# Patient Record
Sex: Female | Born: 1975 | Race: Black or African American | Hispanic: No | Marital: Single | Smoking: Current every day smoker
Health system: Southern US, Community
[De-identification: ages and names within clinical notes are randomized; demographics above are authoritative.]

## PROBLEM LIST (undated history)

## (undated) DIAGNOSIS — Z720 Tobacco use: Secondary | ICD-10-CM

## (undated) DIAGNOSIS — K8020 Calculus of gallbladder without cholecystitis without obstruction: Secondary | ICD-10-CM

## (undated) DIAGNOSIS — K219 Gastro-esophageal reflux disease without esophagitis: Secondary | ICD-10-CM

---

## 1998-09-01 ENCOUNTER — Emergency Department (HOSPITAL_COMMUNITY): Admission: EM | Admit: 1998-09-01 | Discharge: 1998-09-01 | Payer: Self-pay | Admitting: Emergency Medicine

## 1999-01-22 ENCOUNTER — Emergency Department (HOSPITAL_COMMUNITY): Admission: EM | Admit: 1999-01-22 | Discharge: 1999-01-22 | Payer: Self-pay | Admitting: Emergency Medicine

## 1999-06-17 ENCOUNTER — Ambulatory Visit (HOSPITAL_COMMUNITY): Admission: RE | Admit: 1999-06-17 | Discharge: 1999-06-17 | Payer: Self-pay | Admitting: Obstetrics & Gynecology

## 1999-07-01 ENCOUNTER — Inpatient Hospital Stay (HOSPITAL_COMMUNITY): Admission: AD | Admit: 1999-07-01 | Discharge: 1999-07-01 | Payer: Self-pay | Admitting: Obstetrics

## 1999-08-17 ENCOUNTER — Ambulatory Visit (HOSPITAL_COMMUNITY): Admission: RE | Admit: 1999-08-17 | Discharge: 1999-08-17 | Payer: Self-pay | Admitting: *Deleted

## 1999-12-16 ENCOUNTER — Inpatient Hospital Stay (HOSPITAL_COMMUNITY): Admission: AD | Admit: 1999-12-16 | Discharge: 1999-12-16 | Payer: Self-pay | Admitting: Obstetrics

## 2000-01-18 ENCOUNTER — Encounter: Payer: Self-pay | Admitting: Obstetrics

## 2000-01-18 ENCOUNTER — Inpatient Hospital Stay (HOSPITAL_COMMUNITY): Admission: AD | Admit: 2000-01-18 | Discharge: 2000-01-21 | Payer: Self-pay | Admitting: Obstetrics

## 2000-01-18 ENCOUNTER — Encounter (INDEPENDENT_AMBULATORY_CARE_PROVIDER_SITE_OTHER): Payer: Self-pay | Admitting: Specialist

## 2001-12-29 ENCOUNTER — Emergency Department (HOSPITAL_COMMUNITY): Admission: EM | Admit: 2001-12-29 | Discharge: 2001-12-29 | Payer: Self-pay | Admitting: *Deleted

## 2003-06-24 ENCOUNTER — Emergency Department (HOSPITAL_COMMUNITY): Admission: EM | Admit: 2003-06-24 | Discharge: 2003-06-24 | Payer: Self-pay | Admitting: Emergency Medicine

## 2003-12-27 ENCOUNTER — Emergency Department (HOSPITAL_COMMUNITY): Admission: EM | Admit: 2003-12-27 | Discharge: 2003-12-27 | Payer: Self-pay

## 2004-09-08 ENCOUNTER — Emergency Department (HOSPITAL_COMMUNITY): Admission: EM | Admit: 2004-09-08 | Discharge: 2004-09-08 | Payer: Self-pay | Admitting: Emergency Medicine

## 2004-12-31 ENCOUNTER — Ambulatory Visit: Payer: Self-pay | Admitting: Sports Medicine

## 2005-01-06 ENCOUNTER — Encounter (INDEPENDENT_AMBULATORY_CARE_PROVIDER_SITE_OTHER): Payer: Self-pay | Admitting: *Deleted

## 2005-01-06 LAB — CONVERTED CEMR LAB

## 2005-01-07 ENCOUNTER — Ambulatory Visit: Payer: Self-pay | Admitting: Family Medicine

## 2005-01-07 ENCOUNTER — Other Ambulatory Visit: Admission: RE | Admit: 2005-01-07 | Discharge: 2005-01-07 | Payer: Self-pay | Admitting: Family Medicine

## 2005-01-11 ENCOUNTER — Ambulatory Visit (HOSPITAL_COMMUNITY): Admission: RE | Admit: 2005-01-11 | Discharge: 2005-01-11 | Payer: Self-pay | Admitting: Family Medicine

## 2005-02-02 ENCOUNTER — Ambulatory Visit: Payer: Self-pay | Admitting: Family Medicine

## 2005-03-11 ENCOUNTER — Ambulatory Visit: Payer: Self-pay | Admitting: Family Medicine

## 2005-03-15 ENCOUNTER — Ambulatory Visit: Payer: Self-pay | Admitting: Family Medicine

## 2005-03-26 ENCOUNTER — Ambulatory Visit: Payer: Self-pay | Admitting: Family Medicine

## 2005-04-08 ENCOUNTER — Ambulatory Visit: Payer: Self-pay | Admitting: Family Medicine

## 2005-04-22 ENCOUNTER — Ambulatory Visit: Payer: Self-pay | Admitting: Family Medicine

## 2005-05-05 ENCOUNTER — Ambulatory Visit: Payer: Self-pay | Admitting: Family Medicine

## 2005-05-11 ENCOUNTER — Ambulatory Visit: Payer: Self-pay | Admitting: *Deleted

## 2005-06-01 ENCOUNTER — Inpatient Hospital Stay (HOSPITAL_COMMUNITY): Admission: AD | Admit: 2005-06-01 | Discharge: 2005-06-04 | Payer: Self-pay | Admitting: Obstetrics and Gynecology

## 2005-06-01 ENCOUNTER — Ambulatory Visit: Payer: Self-pay | Admitting: Obstetrics and Gynecology

## 2005-06-01 ENCOUNTER — Encounter (INDEPENDENT_AMBULATORY_CARE_PROVIDER_SITE_OTHER): Payer: Self-pay | Admitting: *Deleted

## 2005-07-12 ENCOUNTER — Ambulatory Visit: Payer: Self-pay | Admitting: Sports Medicine

## 2005-09-19 ENCOUNTER — Emergency Department (HOSPITAL_COMMUNITY): Admission: EM | Admit: 2005-09-19 | Discharge: 2005-09-19 | Payer: Self-pay | Admitting: Emergency Medicine

## 2007-02-03 ENCOUNTER — Encounter (INDEPENDENT_AMBULATORY_CARE_PROVIDER_SITE_OTHER): Payer: Self-pay | Admitting: *Deleted

## 2008-08-16 ENCOUNTER — Emergency Department (HOSPITAL_COMMUNITY): Admission: EM | Admit: 2008-08-16 | Discharge: 2008-08-16 | Payer: Self-pay | Admitting: Family Medicine

## 2009-07-01 ENCOUNTER — Emergency Department (HOSPITAL_COMMUNITY): Admission: EM | Admit: 2009-07-01 | Discharge: 2009-07-01 | Payer: Self-pay | Admitting: Emergency Medicine

## 2009-08-31 ENCOUNTER — Emergency Department (HOSPITAL_COMMUNITY): Admission: EM | Admit: 2009-08-31 | Discharge: 2009-08-31 | Payer: Self-pay | Admitting: Emergency Medicine

## 2010-02-01 ENCOUNTER — Emergency Department (HOSPITAL_COMMUNITY): Admission: EM | Admit: 2010-02-01 | Discharge: 2010-02-01 | Payer: Self-pay | Admitting: Family Medicine

## 2010-06-09 ENCOUNTER — Emergency Department (HOSPITAL_COMMUNITY): Admission: EM | Admit: 2010-06-09 | Discharge: 2010-06-09 | Payer: Self-pay | Admitting: Family Medicine

## 2010-06-11 ENCOUNTER — Emergency Department (HOSPITAL_COMMUNITY): Admission: EM | Admit: 2010-06-11 | Discharge: 2010-06-11 | Payer: Self-pay | Admitting: Emergency Medicine

## 2010-10-19 ENCOUNTER — Emergency Department (HOSPITAL_COMMUNITY): Admission: EM | Admit: 2010-10-19 | Discharge: 2010-10-20 | Payer: Self-pay | Admitting: Emergency Medicine

## 2011-02-16 LAB — DIFFERENTIAL
Basophils Relative: 0 % (ref 0–1)
Eosinophils Absolute: 0.2 10*3/uL (ref 0.0–0.7)
Eosinophils Relative: 2 % (ref 0–5)
Lymphs Abs: 2.1 10*3/uL (ref 0.7–4.0)
Neutrophils Relative %: 70 % (ref 43–77)

## 2011-02-16 LAB — COMPREHENSIVE METABOLIC PANEL
ALT: 23 U/L (ref 0–35)
AST: 23 U/L (ref 0–37)
BUN: 8 mg/dL (ref 6–23)
CO2: 23 mEq/L (ref 19–32)
Chloride: 105 mEq/L (ref 96–112)
GFR calc Af Amer: >60 mL/min (ref >60)
Glucose, Bld: 100 mg/dL — ABNORMAL HIGH (ref 70–99)
Potassium: 3.9 mEq/L (ref 3.5–5.1)
Total Protein: 7.7 g/dL (ref 6.0–8.3)

## 2011-02-16 LAB — CBC
MCH: 32 pg (ref 26.0–34.0)
MCHC: 35 g/dL (ref 30.0–36.0)
Platelets: 216 10*3/uL (ref 150–400)
RBC: 4.27 MIL/uL (ref 3.87–5.11)
WBC: 11 10*3/uL — ABNORMAL HIGH (ref 4.0–10.5)

## 2011-03-14 LAB — URINALYSIS, ROUTINE W REFLEX MICROSCOPIC
Ketones, ur: NEGATIVE mg/dL
Protein, ur: NEGATIVE mg/dL

## 2011-03-14 LAB — URINE MICROSCOPIC-ADD ON

## 2011-04-23 NOTE — Discharge Summary (Signed)
[facility]  Patient:    [name], [name]                     MRN: [id number] Adm. Date:  [id number] Disc. Date: [id number] Attending:  Tammi Sou Dictator:   Lucina Mellow, M.D.                           Discharge Summary  HISTORY OF PRESENT ILLNESS:   The patient is a 35 year old, G1, P0, now G1, P1, who was admitted at 41-1/7 weeks for history of rupture of membranes x 2 days.  The  patient was found to have non-favorable cervix and was admitted for induction of labor after cervical ripening.  HOSPITAL COURSE:              The patient was given IV antibiotics prophylactically since she had prolonged rupture of membranes.  Cytotec was placed on the night f admission and she was allowed to sleep.  In the morning, the patients cervix was re-examined and was found to be favorable for labor.  The patient was started on low-dose Pitocin and a four bag of amniotic fluid was ruptured.  It was noted that she had meconium so an IUPC and fetal scalp electrode were placed.  The patient  requested epidural for pain control and we also begin an amino infusion. Despite the above fetal monitoring and amino infusion, the fetus continued to have repetitive deep decelerations and the patient was taken for cesarean section. his was performed on January 19, 2000.  The patient was continued on IV antibiotics for two days for probable chorioamnionitis.  The patient was afebrile postoperatively.  She was doing well second day postop and requested to go home. The patient desires an IUD, but this is not available at this time.  She received Depo-Provera injection until her IUD is inserted.  LABORATORY DATA:              Hemoglobin and hematocrit at discharge are 10.7 and 30.7, respectively.  DISCHARGE MEDICATIONS:        1. Prenatal vitamins.                               2. Tylenol No. 3 to be taken one or two tablets             q.4-6h. p.r.n. pain.                               3.  FOLLOWUP:                     The patient should have a six weeks postpartum check at [facility].  Staples were removed prior to discharge and Steri-Strips were applied to the surgical wound which is healing well.  DISCHARGE DIAGNOSES:          1. Prolonged rupture of membranes with                                  chorioamnionitis.                               2. Induction of labor for a prolonged  rupture of                                  membranes.                               3. Cesarean section for repetitive fetal heart rate                                  deceleration.  The patient had a viable term female infant who was circumcised.  She is bottle feeding him.  His birth weight was 6 pounds 6 ounces and length was 20 inches. He was doing well at the time of discharge and will be sent home with his mother. DD:  01/21/00 TD:  01/22/00 Job: [id number]

## 2011-04-23 NOTE — Op Note (Signed)
[facility]  Patient:    [name], [name]                     MRN: [id number] Proc. Date: 01/19/00 Adm. Date:  [id number] Attending:  Tammi Sou Dictator:   Bernette Redbird, M.D. CC:         Bernette Redbird, M.D.             Conni Elliot, M.D.                           Operative Report  DATE OF BIRTH:  1976-02-23  PREOPERATIVE DIAGNOSIS: 1. 40 and 2/7 week intrauterine pregnancy. 2. Moderate severe repetitive variables. 3. Probable chorioamnionitis.  POSTOPERATIVE DIAGNOSIS: 1. 40 and 2/7 week intrauterine pregnancy. 2. Moderate severe repetitive variables. 3. Probable chorioamnionitis.  OPERATION:  Primary low transverse cesarean section via Pfannenstiel.  SURGEON:  Conni Elliot, M.D.  ASSISTANT:  Bernette Redbird, M.D.  ANESTHESIA:  Epidural.  COMPLICATIONS:  None.  ESTIMATED BLOOD LOSS:  800 ml.  FLUID REPLACEMENT:  1400 ml crystalloid.  URINE:  750 ml clear.  INDICATIONS:  The patient is a 35 year old gravida 1, para 0 at 58 and 2/7 intrauterine pregnancy, SROM x 2 days, moderate severe repetitive variables, maximum dilation 5 cm.  FINDINGS:  Viable female in cephalic presentation born at 5621, shoulder cord, meconium.  Pediatrics present at delivery.  Apgars 9 at one minute and 9 at five minutes.  Weight 6 pounds, 6 ounces.  Cord pH 7.22.  Normal uterus, tubes and ovaries but with adhesions along posterior side of the uterus.  DESCRIPTION OF PROCEDURE:  The patient was taken to the operating room where epidural anesthesia was found to be adequate.  She was then prepped and draped n a in normal sterile fashion in the dorsal supine position with a leftward tilt.  Pfannenstiel skin incision was then made with a scalpel and carried through to he underlying layer of the fascia with the scalpel.  The fascia was nicked in the midline and the incision was extended laterally with the Mayo scissors.   The superior aspect of the fascial incision was then grasped with Kocher clamps, elevated and the underlying rectus muscles dissected off bluntly.  Attention was then turned to the inferior aspect of this incision which, in a similar fashion, was grasped, tented up with a Kocher clamp and the rectus muscles dissected off  bluntly.  The rectus muscles were then separated in the midline, and the peritoneum identified, tented up, and sharply entered with the Metzenbaum scissors.  The peritoneal incision was then extended superiorly and inferiorly with good visualization of the bladder.  The bladder blade was then inserted and the vesicouterine peritoneum identified, grasped with the pickups and entered sharply with the Metzenbaum scissors.  This incision was then extended laterally and the bladder flap created digitally.  The bladder blade was then reinserted and the lower uterine segment incised in a transverse fashion with the scalpel.  A moderate amount of very thin meconium stained fluid was expelled and was malodorous. The vertex was noted to be in the occipital anterior position.  The vertex was delivered with the aid of fundal pressure from the assistant.  The infants mouth and nose were suctioned vigorously with the DeLee suction trap.  The delivery was then completed with the aid of fundal pressure from the assistant.  The infant as noted to have  a shoulder cord.  The umbilical cord was clamped and cut and the infant was handed off to the nursery staff.  Cord pH and cord blood were obtained and the placenta was spontaneously expelled intact from the uterine cavity. The uterus was exteriorized and edges of the uterine incision were grasped with Ring forceps.  The uterine incision was then closed with a continuous interlocking suture of #1 chromic from each corner to the center.  A second layer of the same suture was used to obtained excellent hemostasis.  The bladder flap  was repaired with 2-0 chromic in a running stitch and the uterus was returned to the abdomen. The gutters were cleared of all clots and the peritoneum was closed with 2-0 chromic.  The fascia was reapproximated with 0 Vicryl in a running fashion. The skin was closed with surgical steel staples.  The patient tolerated the procedure well.  Sponge, lap and needle counts were correct x 2.  One gram of cefotatan was given at cord clamp.  The patient was taken to the recovery room in stable condition. DD:  01/19/00 TD:  01/19/00 Job: [id number]

## 2011-04-23 NOTE — Discharge Summary (Signed)
NAMEYAMILET, MCFAYDEN              ACCOUNT NO.:  [id number]   MEDICAL RECORD NO.:  [id number]          PATIENT TYPE:  INP   LOCATION:  9115                          FACILITY:  [facility]   PHYSICIAN:  Thalia Party        DATE OF BIRTH:  1976/10/06   DATE OF ADMISSION:  06/01/2005  DATE OF DISCHARGE:  06/04/2005                                 DISCHARGE SUMMARY   ADMISSION DIAGNOSES:  A 35 year old, G2 P1-0-0-1, 39 weeks 3 days.   DISCHARGE DIAGNOSIS:  1.  A 35 year old, G2 P2-0-0-2.  2. Status post repeat low transverse      cesarean section.   DISCHARGE MEDICATIONS:  1.  Motrin 600 mg p.o. q.6-8h. p.r.n. pain.  2. Percocet 5/325 mg 1-2      tablets every 6 hours p.r.n. pain.  3. Colace 100 mg p.o. daily p.r.n.      constipation.   ADMISSION HISTORY:  Ms. Winberg is a 35 year old G2 P1-0-0-1 that presented to  the MAU on June 01, 2005 at 39 weeks 3 days complaining of uterine  contractions every three to five minutes.  The patient also reported  questionable leakage of fluid at home one to two hours previous to  admission, good fetal activity, no vaginal bleeding.  Speculum exam positive  for ferning, positive for pooling, and Nitrazine reported by RN staff.  Digital cervical exam:  The patient was 1 cm dilated, 100% effaced, and +1  station.  The patient requested an elective repeat cesarean section, refused  trial of vaginal delivery.   The patient was taken back to OR after satisfactory spinal anesthesia.  Repeat cesarean section was performed as well as bilateral tubal ligation.  Delivery of female infant with Apgars of 9 and 9.  Estimated blood loss about  500 mL.  The patient tolerated the procedure well, was transferred to  recovery in satisfactory condition.  The patient remained stable  postoperatively.  Vital signs stable throughout rest of admission, remained  afebrile.  Pain well controlled throughout postoperative care with p.r.n.  Motrin or Percocet.  The patient is able  to ambulate by day of discharge.  The patient will be discharged in stable condition.   FOLLOWUP:  Follow up with [facility] six weeks after delivery date.   DISCHARGE LABS:  Hemoglobin 10.5, RPR nonreactive, rubella nonimmune.  Will  receive rubella vaccination prior to discharge on 06/04/05.       VRE/MEDQ  D:  06/04/2005  T:  06/04/2005  Job:  [id number]

## 2011-04-23 NOTE — Op Note (Signed)
[name], [name]              ACCOUNT NO.:  [id number]   MEDICAL RECORD NO.:  [id number]          PATIENT TYPE:  INP   LOCATION:  9115                          FACILITY:  [facility]   PHYSICIAN:  Phil D. Okey Dupre, M.D.     DATE OF BIRTH:  1976-08-15   DATE OF PROCEDURE:  06/01/2005  DATE OF DISCHARGE:                                 OPERATIVE REPORT   PROCEDURES:  1.  Low transverse cesarean section.  2.  Bilateral tubal ligation.   PREOPERATIVE DIAGNOSES:  1.  Repeat cesarean section with labor.  2.  Voluntary sterilization.   POSTOPERATIVE DIAGNOSES:  1.  Repeat cesarean section with labor.  2.  Voluntary sterilization.   PROCEDURE:  Under satisfactory spinal anesthesia with a Foley catheter in  the urinary bladder, the abdomen was prepped and draped in the usual sterile  manner and entered through a previous transverse incision situated 3 cm  above the symphysis pubis and extending for a total of 16 cm.  The abdomen  was entered by layers.  On entering the peritoneal cavity, the visceral  peritoneum in the anterior surface of the uterus was opened transversely by  sharp dissection, which was pushed over the lower uterine segment, which was  entered by sharp and blunt dissection, and from a direct OP presentation the  baby was easily delivered.  It was a female with Apgar of 9 and 9.  The cord  doubly clamped, divided, the baby handed to the pediatrician, and after the  upper respiratory tract was suctioned with a bulb suction and samples of  blood taken from the cord for analysis, the placenta spontaneously removed  and the uterus closed with a continuous running locked 0 Vicryl on an  atraumatic needle.  There was one small bleeder in the left angle of the  incision, which was reinforced with a figure-of-eight suture of the same  material.  The area was then dry.  Each fallopian tube grasped in the  midportion and sealed off with a Filshie clamp, and the pelvis was irrigated  with  normal saline and instrument and needle count reported correct at this  point, and the fascia closed with a continuous running 0 Vicryl on an  atraumatic needle.  Subcutaneous bleeders controlled with hot cautery, skin  staples used for skin edge approximation.  Total blood loss during the  procedure about 500 mL.  The patient tolerated the procedure well and was  transferred to recovery in satisfactory condition with the Foley catheter  draining clear amber urine at the end of the procedure.  The tape,  instrument, sponge and needle count was again reported at the end of the  procedure.       PDR/MEDQ  D:  06/01/2005  T:  06/01/2005  Job:  [id number]

## 2012-09-20 ENCOUNTER — Emergency Department (INDEPENDENT_AMBULATORY_CARE_PROVIDER_SITE_OTHER)
Admission: EM | Admit: 2012-09-20 | Discharge: 2012-09-20 | Disposition: A | Discharge status: Home / Self Care | Payer: Self-pay | Source: Home / Self Care

## 2012-09-20 ENCOUNTER — Encounter (HOSPITAL_COMMUNITY): Payer: Self-pay

## 2012-09-20 DIAGNOSIS — M545 Low back pain: Secondary | ICD-10-CM

## 2012-09-20 MED ORDER — KETOROLAC TROMETHAMINE 60 MG/2ML IM SOLN
INTRAMUSCULAR | Status: AC
  Filled 2012-09-20: qty 2

## 2012-09-20 MED ORDER — CYCLOBENZAPRINE HCL 10 MG PO TABS: 10.0000 mg | ORAL_TABLET | Freq: Three times a day (TID) | ORAL | Status: DC | PRN

## 2012-09-20 MED ORDER — KETOROLAC TROMETHAMINE 60 MG/2ML IM SOLN
60.0000 mg | Freq: Once | INTRAMUSCULAR | Status: AC
  Administered 2012-09-20: 60 mg via INTRAMUSCULAR

## 2012-09-20 MED ORDER — NAPROXEN 500 MG PO TABS: 500.0000 mg | ORAL_TABLET | Freq: Two times a day (BID) | ORAL | Status: DC

## 2012-09-20 NOTE — ED Provider Notes (Addendum)
History of Present Illness   Patient Identification [name] is a 36 y.o. female.  Patient information was obtained from patient. History/Exam limitations: none. Patient presented to the Emergency Department by private vehicle.  Chief Complaint  Low back pain  HPI:  Patient presents with complaint of low back pain. This is a result of no known injury. Onset of pain was 2 weeks ago and has been gradually worsening. The pain is located in mid lower back, described as aching and rated as 6 / 10, without radiation. Pain is worse with certain movements, bending forward, sitting, lying flat.  The patient denies weakness, numbness, incontinence, fever, tingling. She has never had back pain like this before. Care prior to arrival consisted of acetaminophen with moderate relief.  Better lying on side. Does not work or do heavy lifting.  No past medical history on file. Denies prior medical problems. No HTN, DM. No surgeries. No medications.  Smokes 1/2 ppd Alcohol - 6 pack q twice a week Marijuana Fam history non-contributory.  No known allergies.   Review of Systems Pertinent items are noted in HPI.   Physical Exam  BP 99/63, P 61, T 98.2, 99% room air  LMP 09/09/2012 GEN:  WNWD, no acute distress HEENT:  NCAT, PERRL, EOMI NECK: supple, non-tender CV: RRR, no murmur Resp: CTAB Back:  Tender paraspinously bilateral L3-5 area, muscles tight. Straight leg raise produces shooting pains to bilateral lower back. NEURO: Strength 5/5 bilat upper/lower extrem, DTR equal bilat. Sensation intact.  ED Course   Studies: none  Records Reviewed: past medical  Treatments: toradol 60 mg IM  Consultations: none  Disposition: Home, Naproxen, tylenol, flexeril, supportive care   Napoleon Form, MD 09/20/12 1722  Napoleon Form, MD 09/20/12 1729

## 2012-09-20 NOTE — ED Notes (Signed)
Patient c/o back pain x 2 weeks.

## 2014-09-22 ENCOUNTER — Encounter (HOSPITAL_COMMUNITY): Payer: Self-pay | Admitting: Emergency Medicine

## 2014-09-22 ENCOUNTER — Emergency Department (HOSPITAL_COMMUNITY)
Admission: EM | Admit: 2014-09-22 | Discharge: 2014-09-22 | Disposition: A | Discharge status: Home / Self Care | Payer: Medicaid Other | Attending: Emergency Medicine | Admitting: Emergency Medicine

## 2014-09-22 DIAGNOSIS — Y92410 Unspecified street and highway as the place of occurrence of the external cause: Secondary | ICD-10-CM | POA: Diagnosis not present

## 2014-09-22 DIAGNOSIS — Y9389 Activity, other specified: Secondary | ICD-10-CM | POA: Insufficient documentation

## 2014-09-22 DIAGNOSIS — M545 Low back pain, unspecified: Secondary | ICD-10-CM

## 2014-09-22 DIAGNOSIS — S3992XA Unspecified injury of lower back, initial encounter: Secondary | ICD-10-CM | POA: Insufficient documentation

## 2014-09-22 DIAGNOSIS — Z720 Tobacco use: Secondary | ICD-10-CM | POA: Insufficient documentation

## 2014-09-22 MED ORDER — NAPROXEN 500 MG PO TABS: 500.0000 mg | ORAL_TABLET | Freq: Two times a day (BID) | ORAL | Status: DC

## 2014-09-22 MED ORDER — METHOCARBAMOL 500 MG PO TABS: 500.0000 mg | ORAL_TABLET | Freq: Two times a day (BID) | ORAL | Status: DC | PRN
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
## 2014-09-22 NOTE — ED Provider Notes (Signed)
Medical screening examination/treatment/procedure(s) were performed by non-physician practitioner and as supervising physician I was immediately available for consultation/collaboration.   EKG Interpretation None        Gilda Creasehristopher J. Pollina, MD 09/22/14 2048
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
## 2014-09-22 NOTE — ED Notes (Signed)
Pt escorted to discharge window. Pt verbalized understanding discharge instructions. In no acute distress.  

## 2015-05-08 ENCOUNTER — Emergency Department (HOSPITAL_COMMUNITY)
Admission: EM | Admit: 2015-05-08 | Discharge: 2015-05-08 | Disposition: A | Discharge status: Home / Self Care | Payer: Medicaid Other | Attending: Emergency Medicine | Admitting: Emergency Medicine

## 2015-05-08 ENCOUNTER — Encounter (HOSPITAL_COMMUNITY): Payer: Self-pay | Admitting: Emergency Medicine

## 2015-05-08 DIAGNOSIS — Z791 Long term (current) use of non-steroidal anti-inflammatories (NSAID): Secondary | ICD-10-CM | POA: Insufficient documentation

## 2015-05-08 DIAGNOSIS — M25511 Pain in right shoulder: Secondary | ICD-10-CM | POA: Diagnosis not present

## 2015-05-08 DIAGNOSIS — Z720 Tobacco use: Secondary | ICD-10-CM | POA: Insufficient documentation

## 2015-05-08 DIAGNOSIS — M25531 Pain in right wrist: Secondary | ICD-10-CM | POA: Insufficient documentation

## 2015-05-08 DIAGNOSIS — R200 Anesthesia of skin: Secondary | ICD-10-CM | POA: Diagnosis present

## 2015-05-08 MED ORDER — IBUPROFEN 600 MG PO TABS: 600.0000 mg | ORAL_TABLET | Freq: Four times a day (QID) | ORAL | Status: DC | PRN

## 2015-05-08 NOTE — ED Provider Notes (Signed)
CSN: [id number]     Arrival date & time 05/08/15  [id number] History   First MD Initiated Contact with Patient 05/08/15 1133     Chief Complaint  Patient presents with  . Joint Swelling  . Numbness   HPI   39 year old female presents today with right arm and hand pain. Reports that for the last 2 weeks she has woken up with right hand swelling and pain. She states that the pain starts in her right shoulder and shoots down to her fingertips, with decreased sensation. She reports that after a period of 30-45 minutes symptoms resolve with only lingering shooting pain occasionally. 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 Patient reports some pain with overhead movements of her right arm and shoulder, reports decreased range of motion due to pain. She denies decreased perfusion to the extremity, warm and normal color. Patient denies any headache, neck pain, chest pain, shortness of breath, fevers, weight loss, or any other concerning signs or symptoms at this time.  History reviewed. No pertinent past medical history. Past Surgical History  Procedure Laterality Date  . Cesarean section     No family history on file. History  Substance Use Topics  . Smoking status: Current Every Day Smoker  . Smokeless tobacco: Not on file  . Alcohol Use: Yes     Comment: socially   OB History    No data available     Review of Systems  All other systems reviewed and are negative.   Allergies  Review of patient's allergies indicates no known allergies.  Home Medications   Prior to Admission medications   Medication Sig Start Date End Date Taking? Authorizing Provider  cyclobenzaprine (FLEXERIL) 10 MG tablet Take 1 tablet (10 mg total) by mouth 3 (three) times daily as needed for muscle spasms. 09/20/12   Napoleon FormPamela Ferry, MD  ibuprofen  (ADVIL,MOTRIN) 600 MG tablet Take 1 tablet (600 mg total) by mouth every 6 (six) hours as needed. 05/08/15   Eyvonne MechanicJeffrey Dorena Dorfman, PA-C  methocarbamol (ROBAXIN) 500 MG tablet Take 1 tablet (500 mg total) by mouth 2 (two) times daily as needed for muscle spasms. 09/22/14   Joycie PeekBenjamin Cartner, PA-C  naproxen (NAPROSYN) 500 MG tablet Take 1 tablet (500 mg total) by mouth 2 (two) times daily with a meal. 09/20/12   Napoleon FormPamela Ferry, MD  naproxen (NAPROSYN) 500 MG tablet Take 1 tablet (500 mg total) by mouth 2 (two) times daily. 09/22/14   Joycie PeekBenjamin Cartner, PA-C   BP 111/54 mmHg  Pulse 59  Temp(Src) 98 F (36.7 C) (Oral)  Resp 18  SpO2 98%  LMP 05/08/2015 (Exact Date) Physical Exam  Constitutional: She is oriented to person, place, and time. She appears well-developed and well-nourished.  HENT:  Head: Normocephalic and atraumatic.  Eyes: Conjunctivae are normal. Pupils are equal, round, and reactive to light. Right eye exhibits no discharge. Left eye exhibits no discharge. No scleral icterus.  Neck: Normal range of motion. No JVD present. No tracheal deviation present.  No pain with axial compression of the cervical spine, no pain with rotation, lateral, forward flexion.  Pulmonary/Chest: Effort normal. No stridor.  Musculoskeletal:  Shoulders, elbows, wrists, hands, fingers equal bilateral, no signs of swelling edema, discoloration, or trauma. She is nontender to palpation of right shoulder elbow wrist and hand, negative Tinel's or Phalen on the  right side. Pain with right shoulder flexion, extension, lift off. Reduced flexion, abduction due to pain. Passive range of motion intact.  Neurological: She is alert and oriented to person, place, and time. Coordination normal.  Psychiatric: She has a normal mood and affect. Her behavior is normal. Judgment and thought content normal.  Nursing note and vitals reviewed.   ED Course  Procedures (including critical care time) Labs Review Labs Reviewed - No data to  display  Imaging Review No results found.   EKG Interpretation None     MDM   Final diagnoses:  Right wrist pain  Right shoulder pain   Labs: None  Imaging: None  Consults: None  Therapeutics: None  Assessment: Right shoulder pain, wrist pain  Plan: Patient presents with right shoulder and right wrist pain. No red flags. She denies any significant mechanism injury, reports that she works in a job that requires her to use her upper extremities extensively. She had reduced shoulder range of motion, no signs of swelling or edema on exam, NV intact. Pt was given verbal instructions for shoulder mobility exercises and advised to wear splint at night if symptoms continue to persist. Pt encouraged to follow-up with [facility] and orthopedics. Strict return precautions given, pt verbalized her understanding and agreement to today's plan.       Eyvonne Mechanic, PA-C 05/08/15 1255  Cathren Laine, MD 05/09/15 1450

## 2015-05-08 NOTE — Discharge Instructions (Signed)
Please follow-up with [facility] if symptoms continue to persist. Orthopedic specialist information attach please contact them for further evaluation and management.

## 2015-05-08 NOTE — ED Notes (Signed)
States has swelling in fingers and right arm for 2 weeks, wakes up with swelling in hand every day-- feels tingly now

## 2015-05-12 ENCOUNTER — Encounter (HOSPITAL_BASED_OUTPATIENT_CLINIC_OR_DEPARTMENT_OTHER): Payer: Self-pay | Admitting: Emergency Medicine

## 2018-07-30 ENCOUNTER — Encounter (HOSPITAL_COMMUNITY): Payer: Self-pay

## 2018-07-30 ENCOUNTER — Emergency Department (HOSPITAL_COMMUNITY): Payer: Self-pay

## 2018-07-30 ENCOUNTER — Emergency Department (HOSPITAL_COMMUNITY)
Admission: EM | Admit: 2018-07-30 | Discharge: 2018-07-30 | Disposition: A | Discharge status: Home / Self Care | Payer: Self-pay | Attending: Emergency Medicine | Admitting: Emergency Medicine

## 2018-07-30 DIAGNOSIS — S43111A Subluxation of right acromioclavicular joint, initial encounter: Secondary | ICD-10-CM | POA: Insufficient documentation

## 2018-07-30 DIAGNOSIS — W19XXXA Unspecified fall, initial encounter: Secondary | ICD-10-CM | POA: Insufficient documentation

## 2018-07-30 DIAGNOSIS — F17210 Nicotine dependence, cigarettes, uncomplicated: Secondary | ICD-10-CM | POA: Insufficient documentation

## 2018-07-30 DIAGNOSIS — S43101A Unspecified dislocation of right acromioclavicular joint, initial encounter: Secondary | ICD-10-CM

## 2018-07-30 DIAGNOSIS — Z79899 Other long term (current) drug therapy: Secondary | ICD-10-CM | POA: Insufficient documentation

## 2018-07-30 DIAGNOSIS — F17200 Nicotine dependence, unspecified, uncomplicated: Secondary | ICD-10-CM | POA: Insufficient documentation

## 2018-07-30 DIAGNOSIS — Y999 Unspecified external cause status: Secondary | ICD-10-CM | POA: Insufficient documentation

## 2018-07-30 DIAGNOSIS — Y939 Activity, unspecified: Secondary | ICD-10-CM | POA: Insufficient documentation

## 2018-07-30 DIAGNOSIS — Y929 Unspecified place or not applicable: Secondary | ICD-10-CM | POA: Insufficient documentation

## 2018-07-30 MED ORDER — CYCLOBENZAPRINE HCL 10 MG PO TABS: 10.0000 mg | ORAL_TABLET | Freq: Two times a day (BID) | ORAL | 0 refills | Status: DC | PRN

## 2018-07-30 MED ORDER — HYDROCODONE-ACETAMINOPHEN 5-325 MG PO TABS: 1.0000 | ORAL_TABLET | Freq: Four times a day (QID) | ORAL | 0 refills | Status: DC | PRN

## 2018-07-30 MED ORDER — HYDROMORPHONE HCL 1 MG/ML IJ SOLN
1.0000 mg | Freq: Once | INTRAMUSCULAR | Status: AC
  Administered 2018-07-30: 1 mg via INTRAMUSCULAR
  Filled 2018-07-30: qty 1

## 2018-07-30 MED ORDER — NAPROXEN 500 MG PO TABS: 500.0000 mg | ORAL_TABLET | Freq: Two times a day (BID) | ORAL | 0 refills | Status: DC

## 2018-07-30 NOTE — ED Notes (Signed)
Patient transported to X-ray 

## 2018-07-30 NOTE — Discharge Instructions (Signed)
Ice your shoulder several times a day. Sling as needed. Pain medications as prescribed. Please call and follow up with orthopedics this week.

## 2018-07-30 NOTE — ED Notes (Signed)
D/c reviewed with patient 

## 2018-07-30 NOTE — ED Triage Notes (Signed)
Onset today while  Playing outside with grandchildren, fell forward onto right shoulder.  Obvious deformity

## 2018-07-30 NOTE — ED Provider Notes (Signed)
[facility] EMERGENCY DEPARTMENT Provider Note   CSN: [id number] Arrival date & time: 07/30/18  1605     History   Chief Complaint Chief Complaint  Patient presents with  . Shoulder Injury    HPI [name] is a 43 y.o. female.  HPI  [name] is a 42 y.o. female presents to emergency department complaining of shoulder injury.  Patient states that that she was playing with her nephew and fell onto the right arm.  She denies hitting her head or loss of consciousness.  She states pain is mainly in her shoulder.  She states there is deformity in her shoulder.  Denies any numbness or weakness in her arm.  Denies any other injuries.  She did not take any prior to coming in.  Pain with any range of motion of the shoulder joint.   History reviewed. No pertinent past medical history.  There are no active problems to display for this patient.   Past Surgical History:  Procedure Laterality Date  . CESAREAN SECTION       OB History   None      Home Medications    Prior to Admission medications   Medication Sig Start Date End Date Taking? Authorizing Provider  cyclobenzaprine (FLEXERIL) 10 MG tablet Take 1 tablet (10 mg total) by mouth 3 (three) times daily as needed for muscle spasms. 09/20/12   Napoleon Form, MD  ibuprofen (ADVIL,MOTRIN) 600 MG tablet Take 1 tablet (600 mg total) by mouth every 6 (six) hours as needed. 05/08/15   Hedges, Tinnie Gens, PA-C  methocarbamol (ROBAXIN) 500 MG tablet Take 1 tablet (500 mg total) by mouth 2 (two) times daily as needed for muscle spasms. 09/22/14   Cartner, Sharlet [place]ina, PA-C  naproxen (NAPROSYN) 500 MG tablet Take 1 tablet (500 mg total) by mouth 2 (two) times daily with a meal. 09/20/12   Napoleon Form, MD  naproxen (NAPROSYN) 500 MG tablet Take 1 tablet (500 mg total) by mouth 2 (two) times daily. 09/22/14   Joycie Peek, PA-C    Family History History reviewed. No pertinent family history.  Social  History Social History   Tobacco Use  . Smoking status: Current Every Day Smoker  . Smokeless tobacco: Never Used  Substance Use Topics  . Alcohol use: Yes    Comment: socially  . Drug use: Yes    Types: Marijuana     Allergies   Patient has no known allergies.   Review of Systems Review of Systems  Constitutional: Negative for chills and fever.  Musculoskeletal: Positive for arthralgias and joint swelling. Negative for neck pain.  Neurological: Negative for weakness, numbness and headaches.  All other systems reviewed and are negative.    Physical Exam Updated Vital Signs BP (!) 136/91 (BP Location: Left Arm)   Pulse 90   Temp 98.1 F (36.7 C) (Oral)   Resp (!) 22   SpO2 98%   Physical Exam  Constitutional: She appears well-developed and well-nourished. No distress.  Eyes: Conjunctivae are normal.  Neck: Neck supple.  Musculoskeletal:  Obvious deformity in the right shoulder, mainly at [facility] joint.  Diffuse tenderness to palpation.  Humeral head appears to be in a correct anatomical position.  Normal elbow and wrist.  Distal radial pulses intact.  Sensation is intact in all dermatomes in the arm and hand.  Neurological: She is alert.  Skin: Skin is warm and dry.  Nursing note and vitals reviewed.    ED Treatments /  Results  Labs (all labs ordered are listed, but only abnormal results are displayed) Labs Reviewed - No data to display  EKG None  Radiology No results found.  Procedures Procedures (including critical care time)  Medications Ordered in ED Medications  HYDROmorphone (DILAUDID) injection 1 mg (1 mg Intramuscular Given 07/30/18 1641)     Initial Impression / Assessment and Plan / ED Course  I have reviewed the triage vital signs and the nursing notes.  Pertinent labs & imaging results that were available during my care of the patient were reviewed by me and considered in my medical decision making (see chart for details).     Pt with  right shoulder injury after a fall. [facility]. Neurovascularly intact. Xray obtained showing type 3 AC separation. WIll place in a sling. Home with ice, nsaids, pain meds, muscle relaxants, follow up with ortho.   Vitals:   07/30/18 1613  BP: (!) 136/91  Pulse: 90  Resp: (!) 22  Temp: 98.1 F (36.7 C)  TempSrc: Oral  SpO2: 98%     Final Clinical Impressions(s) / ED Diagnoses   Final diagnoses:  AC separation, type 3, right, initial encounter    ED Discharge Orders    None       Jaynie CrumbleKirichenko, Vernelle Wisner, PA-C 07/30/18 1730    Cathren LaineSteinl, Kevin, MD 07/30/18 [phone]

## 2018-10-09 ENCOUNTER — Emergency Department (HOSPITAL_COMMUNITY): Payer: Medicaid Other

## 2018-10-09 ENCOUNTER — Other Ambulatory Visit: Payer: Self-pay

## 2018-10-09 ENCOUNTER — Emergency Department (HOSPITAL_COMMUNITY)
Admission: EM | Admit: 2018-10-09 | Discharge: 2018-10-09 | Disposition: A | Discharge status: Home / Self Care | Payer: Medicaid Other | Attending: Emergency Medicine | Admitting: Emergency Medicine

## 2018-10-09 ENCOUNTER — Encounter (HOSPITAL_COMMUNITY): Payer: Self-pay | Admitting: *Deleted

## 2018-10-09 DIAGNOSIS — R0781 Pleurodynia: Secondary | ICD-10-CM | POA: Diagnosis not present

## 2018-10-09 DIAGNOSIS — R079 Chest pain, unspecified: Secondary | ICD-10-CM | POA: Diagnosis present

## 2018-10-09 DIAGNOSIS — F17200 Nicotine dependence, unspecified, uncomplicated: Secondary | ICD-10-CM | POA: Insufficient documentation

## 2018-10-09 LAB — HEPATIC FUNCTION PANEL
ALBUMIN: 3.5 g/dL (ref 3.5–5.0)
ALT: 35 U/L (ref 0–44)
AST: 29 U/L (ref 15–41)
Alkaline Phosphatase: 86 U/L (ref 38–126)
BILIRUBIN INDIRECT: 0.7 mg/dL (ref 0.3–0.9)
Bilirubin, Direct: 0.2 mg/dL (ref 0.0–0.2)
TOTAL PROTEIN: 7.3 g/dL (ref 6.5–8.1)
Total Bilirubin: 0.9 mg/dL (ref 0.3–1.2)

## 2018-10-09 LAB — BASIC METABOLIC PANEL
Anion gap: 8 (ref 5–15)
BUN: 7 mg/dL (ref 6–20)
CHLORIDE: 102 mmol/L (ref 98–111)
CO2: 27 mmol/L (ref 22–32)
Calcium: 9.6 mg/dL (ref 8.9–10.3)
Creatinine, Ser: 0.77 mg/dL (ref 0.44–1.00)
GFR calc Af Amer: >60 mL/min (ref >60)
GFR calc non Af Amer: >60 mL/min (ref >60)
GLUCOSE: 276 mg/dL — AB (ref 70–99)
Potassium: 5.3 mmol/L — ABNORMAL HIGH (ref 3.5–5.1)
Sodium: 137 mmol/L (ref 135–145)

## 2018-10-09 LAB — I-STAT TROPONIN, ED
Troponin i, poc: 0.01 ng/mL (ref 0.00–0.08)
Troponin i, poc: 0 ng/mL (ref 0.00–0.08)

## 2018-10-09 LAB — CBC
HEMATOCRIT: 45.6 % (ref 36.0–46.0)
Hemoglobin: 15.4 g/dL — ABNORMAL HIGH (ref 12.0–15.0)
MCH: 30.7 pg (ref 26.0–34.0)
MCHC: 33.8 g/dL (ref 30.0–36.0)
MCV: 90.8 fL (ref 80.0–100.0)
Platelets: 238 10*3/uL (ref 150–400)
RBC: 5.02 MIL/uL (ref 3.87–5.11)
RDW: 11.4 % — ABNORMAL LOW (ref 11.5–15.5)
WBC: 10.3 10*3/uL (ref 4.0–10.5)
nRBC: 0 % (ref 0.0–0.2)

## 2018-10-09 LAB — I-STAT BETA HCG BLOOD, ED (MC, WL, AP ONLY): I-stat hCG, quantitative: <5 m[IU]/mL (ref <5)

## 2018-10-09 LAB — D-DIMER, QUANTITATIVE (NOT AT ARMC)

## 2018-10-09 LAB — LIPASE, BLOOD: LIPASE: 38 U/L (ref 11–51)

## 2018-10-09 MED ORDER — FAMOTIDINE 20 MG PO TABS: 20.0000 mg | ORAL_TABLET | Freq: Two times a day (BID) | ORAL | 0 refills | Status: DC

## 2018-10-09 MED ORDER — METHOCARBAMOL 500 MG PO TABS: 1000.0000 mg | ORAL_TABLET | Freq: Four times a day (QID) | ORAL | 0 refills | Status: DC | PRN

## 2018-10-09 MED ORDER — IBUPROFEN 600 MG PO TABS: 600.0000 mg | ORAL_TABLET | Freq: Four times a day (QID) | ORAL | 0 refills | Status: DC | PRN

## 2018-10-09 MED ORDER — METHOCARBAMOL 500 MG PO TABS
1000.0000 mg | ORAL_TABLET | Freq: Once | ORAL | Status: AC
  Administered 2018-10-09: 1000 mg via ORAL
  Filled 2018-10-09: qty 2

## 2018-10-09 MED ORDER — KETOROLAC TROMETHAMINE 30 MG/ML IJ SOLN
30.0000 mg | Freq: Once | INTRAMUSCULAR | Status: AC
  Administered 2018-10-09: 30 mg via INTRAVENOUS
  Filled 2018-10-09: qty 1

## 2018-10-09 NOTE — ED Provider Notes (Signed)
[facility] EMERGENCY DEPARTMENT Provider Note   CSN: [id number] Arrival date & time: 10/09/18  1025     History   Chief Complaint Chief Complaint  Patient presents with  . Chest Pain    HPI [name] is a 42 y.o. female.  HPI Patient developed acute onset central chest pain radiating through to her back yesterday evening.  Worse with movement and deep breathing.  No associated cough or shortness of breath.  Patient states she had nausea with several episodes of emesis.  No gross blood or coffee ground vomit.  No fever or chills.  No new lower extremity swelling or pain.  Patient states she took Rolaids with no relief.  No previously similar symptoms.  No recent trauma or heavy lifting. History reviewed. No pertinent past medical history.  There are no active problems to display for this patient.   Past Surgical History:  Procedure Laterality Date  . CESAREAN SECTION       OB History   None      Home Medications    Prior to Admission medications   Medication Sig Start Date End Date Taking? Authorizing Provider  cyclobenzaprine (FLEXERIL) 10 MG tablet Take 1 tablet (10 mg total) by mouth 2 (two) times daily as needed for muscle spasms. 07/30/18   Kirichenko, Tatyana, PA-C  famotidine (PEPCID) 20 MG tablet Take 1 tablet (20 mg total) by mouth 2 (two) times daily. 10/09/18   Loren Racer, MD  HYDROcodone-acetaminophen (NORCO) 5-325 MG tablet Take 1 tablet by mouth every 6 (six) hours as needed for moderate pain. 07/30/18   Kirichenko, Lemont Fillers, PA-C  ibuprofen (ADVIL,MOTRIN) 600 MG tablet Take 1 tablet (600 mg total) by mouth every 6 (six) hours as needed. 10/09/18   Loren Racer, MD  methocarbamol (ROBAXIN) 500 MG tablet Take 2 tablets (1,000 mg total) by mouth every 6 (six) hours as needed for muscle spasms. 10/09/18   Loren Racer, MD  naproxen (NAPROSYN) 500 MG tablet Take 1 tablet (500 mg total) by mouth 2 (two) times daily. 07/30/18    Jaynie Crumble, PA-C    Family History History reviewed. No pertinent family history.  Social History Social History   Tobacco Use  . Smoking status: Current Every Day Smoker  . Smokeless tobacco: Never Used  Substance Use Topics  . Alcohol use: Yes    Comment: socially  . Drug use: Yes    Types: Marijuana     Allergies   Patient has no known allergies.   Review of Systems Review of Systems  Constitutional: Negative for chills and fever.  HENT: Negative for sore throat and trouble swallowing.   Eyes: Negative for visual disturbance.  Respiratory: Negative for cough and shortness of breath.   Cardiovascular: Positive for chest pain. Negative for palpitations and leg swelling.  Gastrointestinal: Positive for nausea and vomiting. Negative for abdominal pain, constipation and diarrhea.  Genitourinary: Negative for dysuria, flank pain and frequency.  Musculoskeletal: Positive for back pain. Negative for arthralgias, myalgias and neck pain.  Skin: Negative for rash and wound.  Neurological: Negative for dizziness, weakness, light-headedness, numbness and headaches.  All other systems reviewed and are negative.    Physical Exam Updated Vital Signs BP (!) 148/85   Pulse (!) 55   Temp 98.2 F (36.8 C) (Oral)   Resp 14   Ht 5\' 9"  (1.753 m)   Wt 86.2 kg   LMP  (LMP Unknown)   SpO2 100%   BMI 28.06 kg/m  Physical Exam  Constitutional: She is oriented to person, place, and time. She appears well-developed and well-nourished. No distress.  HENT:  Head: Normocephalic and atraumatic.  Mouth/Throat: Oropharynx is clear and moist. No oropharyngeal exudate.  Eyes: Pupils are equal, round, and reactive to light. EOM are normal.  Neck: Normal range of motion. Neck supple. No JVD present.  No meningismus  Cardiovascular: Normal rate and regular rhythm. Exam reveals no gallop and no friction rub.  No murmur heard. Pulmonary/Chest: Effort normal and breath sounds normal.  No stridor. No respiratory distress. She has no wheezes. She has no rales. She exhibits tenderness.  Left inferior parasternal chest wall tenderness to palpation.  No crepitance or deformity.  Abdominal: Soft. Bowel sounds are normal. There is no tenderness. There is no rebound and no guarding.  Musculoskeletal: Normal range of motion. She exhibits no edema or tenderness.  Patient has diffuse bilateral inferior thoracic muscular tenderness to palpation.  No midline thoracic or lumbar tenderness.  No CVA tenderness.  No lower extremity swelling, asymmetry or tenderness.  Distal pulses are 2+.  Lymphadenopathy:    She has no cervical adenopathy.  Neurological: She is alert and oriented to person, place, and time.  Moving all extremities without focal deficit.  Sensation intact.  Skin: Skin is warm and dry. Capillary refill takes less than 2 seconds. No rash noted. She is not diaphoretic. No erythema.  Psychiatric: She has a normal mood and affect. Her behavior is normal.  Nursing note and vitals reviewed.    ED Treatments / Results  Labs (all labs ordered are listed, but only abnormal results are displayed) Labs Reviewed  BASIC METABOLIC PANEL - Abnormal; Notable for the following components:      Result Value   Potassium 5.3 (*)    Glucose, Bld 276 (*)    All other components within normal limits  CBC - Abnormal; Notable for the following components:   Hemoglobin 15.4 (*)    RDW 11.4 (*)    All other components within normal limits  LIPASE, BLOOD  HEPATIC FUNCTION PANEL  D-DIMER, QUANTITATIVE (NOT AT [facility])  I-STAT TROPONIN, ED  I-STAT BETA HCG BLOOD, ED ([facility], [facility], [facility] ONLY)  I-STAT TROPONIN, ED    EKG EKG Interpretation  Date/Time:  Monday October 09 2018 10:46:42 EST Ventricular Rate:  61 PR Interval:  128 QRS Duration: 92 QT Interval:  478 QTC Calculation: 481 R Axis:   79 Text Interpretation:  Normal sinus rhythm Prolonged QT Abnormal ECG Confirmed by Loren Racer  ([id number]) on 10/09/2018 5:18:23 PM   Radiology Dg Chest 2 View  Result Date: 10/09/2018 CLINICAL DATA:  Left-sided chest and back pain since last night with some shortness-of-breath. EXAM: CHEST - 2 VIEW COMPARISON:  10/19/2010 FINDINGS: Lungs are adequately inflated without lobar consolidation or effusion. Minimal linear density over the right base likely atelectasis/scarring. Cardiomediastinal silhouette and remainder the exam is unchanged. IMPRESSION: No active cardiopulmonary disease. Electronically Signed   By: Elberta Fortis M.D.   On: 10/09/2018 11:57    Procedures Procedures (including critical care time)  Medications Ordered in ED Medications  ketorolac (TORADOL) 30 MG/ML injection 30 mg (30 mg Intravenous Given 10/09/18 1929)  methocarbamol (ROBAXIN) tablet 1,000 mg (1,000 mg Oral Given 10/09/18 1928)     Initial Impression / Assessment and Plan / ED Course  I have reviewed the triage vital signs and the nursing notes.  Pertinent labs & imaging results that were available during my care of the patient were reviewed  by me and considered in my medical decision making (see chart for details).     Troponin x2 is normal.  Normal d-dimer.  Suspect pleurisy versus musculoskeletal chest pain.  Will treat with NSAIDs.  Return precautions given.  Final Clinical Impressions(s) / ED Diagnoses   Final diagnoses:  Pleuritic chest pain    ED Discharge Orders         Ordered    ibuprofen (ADVIL,MOTRIN) 600 MG tablet  Every 6 hours PRN     10/09/18 1949    famotidine (PEPCID) 20 MG tablet  2 times daily     10/09/18 1949    methocarbamol (ROBAXIN) 500 MG tablet  Every 6 hours PRN     10/09/18 1949           Loren Racer, MD 10/09/18 1950

## 2018-10-09 NOTE — ED Triage Notes (Signed)
Pt in c/o chest pain that started last night, pain radiates into her back, also nausea with this, thought she had indigestion but no relief with heart burn medications, no distress noted

## 2020-01-09 ENCOUNTER — Encounter (HOSPITAL_COMMUNITY): Payer: Self-pay

## 2020-01-09 ENCOUNTER — Other Ambulatory Visit: Payer: Self-pay

## 2020-01-09 ENCOUNTER — Ambulatory Visit (HOSPITAL_COMMUNITY)
Admission: EM | Admit: 2020-01-09 | Discharge: 2020-01-09 | Disposition: A | Discharge status: Home / Self Care | Payer: Medicaid Other | Attending: Emergency Medicine | Admitting: Emergency Medicine

## 2020-01-09 DIAGNOSIS — F17200 Nicotine dependence, unspecified, uncomplicated: Secondary | ICD-10-CM

## 2020-01-09 DIAGNOSIS — R0789 Other chest pain: Secondary | ICD-10-CM

## 2020-01-09 DIAGNOSIS — K219 Gastro-esophageal reflux disease without esophagitis: Secondary | ICD-10-CM

## 2020-01-09 MED ORDER — FAMOTIDINE 20 MG PO TABS: 20.0000 mg | ORAL_TABLET | Freq: Two times a day (BID) | ORAL | 0 refills | Status: DC

## 2020-01-09 MED ORDER — OMEPRAZOLE 20 MG PO CPDR: 20.0000 mg | DELAYED_RELEASE_CAPSULE | Freq: Every day | ORAL | 0 refills | Status: DC

## 2020-01-09 MED ORDER — MELOXICAM 7.5 MG PO TABS: 7.5000 mg | ORAL_TABLET | Freq: Every day | ORAL | 0 refills | Status: DC

## 2020-01-09 NOTE — ED Provider Notes (Signed)
[facility]   MRN: [id number] DOB: [birth year]  Subjective:   [name] is a 44 y.o. female presenting for acute onset of mid-upper chest pain that is focal. Smokes 1-1.5ppd. Smokes marijuana a few times a month. Eats a mixed variety of foods. Eats fried foods, spicy foods, caffeine beverages. Denies direct COVID 19 contacts/exposure.  No current facility-administered medications for this encounter.  Current Outpatient Medications:  .  cyclobenzaprine (FLEXERIL) 10 MG tablet, Take 1 tablet (10 mg total) by mouth 2 (two) times daily as needed for muscle spasms., Disp: 20 tablet, Rfl: 0 .  famotidine (PEPCID) 20 MG tablet, Take 1 tablet (20 mg total) by mouth 2 (two) times daily., Disp: 30 tablet, Rfl: 0 .  HYDROcodone-acetaminophen (NORCO) 5-325 MG tablet, Take 1 tablet by mouth every 6 (six) hours as needed for moderate pain., Disp: 20 tablet, Rfl: 0 .  ibuprofen (ADVIL,MOTRIN) 600 MG tablet, Take 1 tablet (600 mg total) by mouth every 6 (six) hours as needed., Disp: 30 tablet, Rfl: 0 .  methocarbamol (ROBAXIN) 500 MG tablet, Take 2 tablets (1,000 mg total) by mouth every 6 (six) hours as needed for muscle spasms., Disp: 30 tablet, Rfl: 0 .  naproxen (NAPROSYN) 500 MG tablet, Take 1 tablet (500 mg total) by mouth 2 (two) times daily., Disp: 30 tablet, Rfl: 0   No Known Allergies  History reviewed. No pertinent past medical history.   Past Surgical History:  Procedure Laterality Date  . CESAREAN SECTION      History reviewed. No pertinent family history.  Social History   Tobacco Use  . Smoking status: Current Every Day Smoker    Packs/day: 1.50  . Smokeless tobacco: Never Used  Substance Use Topics  . Alcohol use: Yes    Comment: socially  . Drug use: Yes    Types: Marijuana    Review of Systems  Constitutional: Negative for fever and malaise/fatigue.  HENT: Negative for congestion, ear pain, sinus pain and sore throat.   Eyes: Negative for blurred vision,  double vision, discharge and redness.  Respiratory: Negative for cough, hemoptysis, shortness of breath and wheezing.   Cardiovascular: Positive for chest pain.  Gastrointestinal: Negative for abdominal pain, diarrhea, nausea and vomiting.  Genitourinary: Negative for dysuria, flank pain and hematuria.  Musculoskeletal: Negative for myalgias.  Skin: Negative for rash.  Neurological: Negative for dizziness, weakness and headaches.  Psychiatric/Behavioral: Negative for depression and substance abuse.     Objective:   Vitals: BP 113/75 (BP Location: Right Arm)   Pulse 81   Temp 98.3 F (36.8 C) (Oral)   Resp 18   LMP  (LMP Unknown)   SpO2 96%   Physical Exam Constitutional:      General: She is not in acute distress.    Appearance: Normal appearance. She is well-developed. She is not ill-appearing, toxic-appearing or diaphoretic.  HENT:     Head: Normocephalic and atraumatic.     Nose: Nose normal.     Mouth/Throat:     Mouth: Mucous membranes are moist.  Eyes:     Extraocular Movements: Extraocular movements intact.     Pupils: Pupils are equal, round, and reactive to light.  Cardiovascular:     Rate and Rhythm: Normal rate and regular rhythm.     Pulses: Normal pulses.     Heart sounds: Normal heart sounds. No murmur. No friction rub. No gallop.   Pulmonary:     Effort: Pulmonary effort is normal. No accessory muscle usage or  respiratory distress.     Breath sounds: Normal breath sounds. No stridor. No wheezing, rhonchi or rales.  Chest:     Chest wall: No tenderness.  Skin:    General: Skin is warm and dry.     Findings: No rash.  Neurological:     Mental Status: She is alert and oriented to person, place, and time.  Psychiatric:        Mood and Affect: Mood normal.        Behavior: Behavior normal.        Thought Content: Thought content normal.        Judgment: Judgment normal.     ED ECG REPORT   Date: 01/09/2020  Rate: 80bpm  Rhythm: normal sinus  rhythm  QRS Axis: normal  Intervals: QT prolonged  ST/T Wave abnormalities: normal  Conduction Disutrbances:none  Narrative Interpretation: Sinus rhythm at 80 bpm with QT prolongation.  Unchanged from previous EKG in 2019.  Old EKG Reviewed: unchanged  I have personally reviewed the EKG tracing and agree with the computerized printout as noted.   Assessment and Plan :   1. Atypical chest pain   2. Tobacco use disorder   3. Gastroesophageal reflux disease, unspecified whether esophagitis present     Patient was required by her work to come and be checked.  Low suspicion for COVID-19, patient refused COVID-19 test.  Discussed need to cut back and quit her smoking.  Also recommended dietary modifications to address possible GERD.  Start Pepcid and Prilosec for this.  For supportive care and to address her pain, will use meloxicam as needed. Counseled patient on potential for adverse effects with medications prescribed/recommended today, ER and return-to-clinic precautions discussed, patient verbalized understanding.    Jaynee Eagles, [place] 01/09/20 1545

## 2020-01-09 NOTE — Discharge Instructions (Signed)
Start taking Pepcid and Prilosec today. Pepcid will help you quicker but stops working after ~3-4 weeks of persistent use. Keep using Prilosec long term, daily to help address reflux as a source of your symptoms. Cut back on Pepcid after ~2 weeks. Use meloxicam for pain only as needed.

## 2020-01-09 NOTE — ED Triage Notes (Signed)
Pt reports having "shooting chest pain" started this morning. Pt reports the chest pain can be related to her smoking habit.

## 2020-03-25 ENCOUNTER — Emergency Department (HOSPITAL_COMMUNITY): Payer: Self-pay

## 2020-03-25 ENCOUNTER — Encounter (HOSPITAL_COMMUNITY): Payer: Self-pay

## 2020-03-25 ENCOUNTER — Other Ambulatory Visit: Payer: Self-pay

## 2020-03-25 ENCOUNTER — Emergency Department (HOSPITAL_COMMUNITY)
Admission: EM | Admit: 2020-03-25 | Discharge: 2020-03-25 | Disposition: A | Discharge status: Home / Self Care | Payer: Self-pay | Attending: Emergency Medicine | Admitting: Emergency Medicine

## 2020-03-25 DIAGNOSIS — Z79899 Other long term (current) drug therapy: Secondary | ICD-10-CM | POA: Insufficient documentation

## 2020-03-25 DIAGNOSIS — J40 Bronchitis, not specified as acute or chronic: Secondary | ICD-10-CM | POA: Insufficient documentation

## 2020-03-25 DIAGNOSIS — F17210 Nicotine dependence, cigarettes, uncomplicated: Secondary | ICD-10-CM | POA: Insufficient documentation

## 2020-03-25 MED ORDER — ALBUTEROL SULFATE HFA 108 (90 BASE) MCG/ACT IN AERS: 2.0000 | INHALATION_SPRAY | RESPIRATORY_TRACT | 0 refills | Status: DC | PRN

## 2020-03-25 MED ORDER — PREDNISONE 10 MG PO TABS: 40.0000 mg | ORAL_TABLET | Freq: Every day | ORAL | 0 refills | Status: AC

## 2020-03-25 MED ORDER — PREDNISONE 10 MG PO TABS: 40.0000 mg | ORAL_TABLET | Freq: Every day | ORAL | 0 refills | Status: DC

## 2020-03-25 NOTE — Discharge Instructions (Signed)
Thank you for allowing me to care for you today. Please return to the emergency department if you have new or worsening symptoms. Take your medications as instructed.  ° °

## 2020-03-25 NOTE — ED Triage Notes (Signed)
Patient c/o SOB and states she feels like she can not take a deep breath since waking this AM.

## 2020-03-25 NOTE — ED Provider Notes (Signed)
[facility] DEPT Provider Note   CSN: [id number] Arrival date & time: 03/25/20  1324     History Chief Complaint  Patient presents with  . Shortness of Breath    [name] is a 44 y.o. female.  Patient is a 44 year old female who smokes with no other past medical history presenting to the emergency department for shortness of breath that started this morning.  Patient reports that this feels like when she was diagnosed with bronchitis before in the past.  Reports that she has not had any chest pain, cough, fever, URI symptoms or Covid exposure.        History reviewed. No pertinent past medical history.  There are no problems to display for this patient.   Past Surgical History:  Procedure Laterality Date  . CESAREAN SECTION       OB History   No obstetric history on file.     Family History  Problem Relation Age of Onset  . Hypertension Mother     Social History   Tobacco Use  . Smoking status: Current Every Day Smoker    Packs/day: 1.50  . Smokeless tobacco: Never Used  Substance Use Topics  . Alcohol use: Yes    Comment: socially  . Drug use: Yes    Types: Marijuana    Home Medications Prior to Admission medications   Medication Sig Start Date End Date Taking? Authorizing Provider  albuterol (VENTOLIN HFA) 108 (90 Base) MCG/ACT inhaler Inhale 2 puffs into the lungs every 4 (four) hours as needed for wheezing or shortness of breath. 03/25/20 04/24/20  Alveria Apley, PA-C  famotidine (PEPCID) 20 MG tablet Take 1 tablet (20 mg total) by mouth 2 (two) times daily. 01/09/20   Jaynee Eagles, PA-C  meloxicam (MOBIC) 7.5 MG tablet Take 1 tablet (7.5 mg total) by mouth daily. 01/09/20   Jaynee Eagles, PA-C  omeprazole (PRILOSEC) 20 MG capsule Take 1 capsule (20 mg total) by mouth daily. 01/09/20   Jaynee Eagles, PA-C  predniSONE (DELTASONE) 10 MG tablet Take 4 tablets (40 mg total) by mouth daily for 5 days. 03/25/20 03/30/20  Alveria Apley, PA-C    Allergies    Patient has no known allergies.  Review of Systems   Review of Systems  Constitutional: Negative for chills and fever.  HENT: Negative for ear pain and sore throat.   Eyes: Negative for pain and visual disturbance.  Respiratory: Positive for shortness of breath. Negative for cough.   Cardiovascular: Negative for chest pain, palpitations and leg swelling.  Gastrointestinal: Negative for abdominal pain and vomiting.  Genitourinary: Negative for dysuria and hematuria.  Musculoskeletal: Negative for arthralgias and back pain.  Skin: Negative for color change and rash.  Neurological: Negative for seizures and syncope.  All other systems reviewed and are negative.   Physical Exam Updated Vital Signs BP 113/75 (BP Location: Left Arm)   Pulse 97   Temp 98.7 F (37.1 C)   Resp 18   Ht 5\' 9"  (1.753 m)   Wt 83.9 kg   LMP  (LMP Unknown)   SpO2 97%   BMI 27.32 kg/m   Physical Exam Vitals and nursing note reviewed.  Constitutional:      General: She is not in acute distress.    Appearance: Normal appearance. She is well-developed. She is not ill-appearing, toxic-appearing or diaphoretic.  HENT:     Head: Normocephalic.     Mouth/Throat:     Mouth: Mucous membranes  are moist.  Eyes:     Conjunctiva/sclera: Conjunctivae normal.  Cardiovascular:     Rate and Rhythm: Normal rate and regular rhythm.  Pulmonary:     Effort: Pulmonary effort is normal.     Breath sounds: Examination of the right-upper field reveals wheezing. Examination of the left-upper field reveals wheezing. Examination of the right-middle field reveals wheezing. Examination of the left-middle field reveals wheezing. Examination of the right-lower field reveals wheezing. Examination of the left-lower field reveals wheezing. Wheezing present. No rhonchi or rales.  Musculoskeletal:     Right lower leg: No edema.     Left lower leg: No edema.  Skin:    General: Skin is dry.    Neurological:     Mental Status: She is alert.  Psychiatric:        Mood and Affect: Mood normal.     ED Results / Procedures / Treatments   Labs (all labs ordered are listed, but only abnormal results are displayed) Labs Reviewed - No data to display  EKG None  Radiology DG Chest 2 View  Result Date: 03/25/2020 CLINICAL DATA:  Shortness of breath beginning today. EXAM: CHEST - 2 VIEW COMPARISON:  10/09/2018 FINDINGS: Heart size is normal. Mediastinal shadows are normal. The lungs are clear. The pulmonary vascularity is normal. No effusions. No abnormal bone finding. IMPRESSION: Normal chest Electronically Signed   By: Paulina Fusi M.D.   On: 03/25/2020 14:37    Procedures Procedures (including critical care time)  Medications Ordered in ED Medications - No data to display  ED Course  I have reviewed the triage vital signs and the nursing notes.  Pertinent labs & imaging results that were available during my care of the patient were reviewed by me and considered in my medical decision making (see chart for details).  Clinical Course as of Mar 25 1718  Tue Mar 25, 2020  1718 Well-appearing female presenting with shortness of breath similar to episodes of acute bronchitis she has had in the past.  She is a smoker.  Overall well-appearing on exam with normal vital signs.  PERC negative.  No chest pain.  She does have some scattered wheezing.  We will treat her with albuterol and prednisone.  Advised on return precautions.   [KM]    Clinical Course User Index [KM] Jeral Pinch   MDM Rules/Calculators/A&P                      Based on review of vitals, medical screening exam, lab work and/or imaging, there does not appear to be an acute, emergent etiology for the patient's symptoms. Counseled pt on good return precautions and encouraged both PCP and ED follow-up as needed.  Prior to discharge, I also discussed incidental imaging findings with patient in detail and  advised appropriate, recommended follow-up in detail.  Clinical Impression: 1. Bronchitis     Disposition: Discharge  Prior to providing a prescription for a controlled substance, I independently reviewed the patient's recent prescription history on the [place] Controlled Substance Reporting System. The patient had no recent or regular prescriptions and was deemed appropriate for a brief, less than 3 day prescription of narcotic for acute analgesia.  This note was prepared with assistance of Conservation officer, historic buildings. Occasional wrong-word or sound-a-like substitutions may have occurred due to the inherent limitations of voice recognition software.  Final Clinical Impression(s) / ED Diagnoses Final diagnoses:  Bronchitis    Rx / DC Orders  ED Discharge Orders         Ordered    albuterol (VENTOLIN HFA) 108 (90 Base) MCG/ACT inhaler  Every 4 hours PRN     03/25/20 1718    predniSONE (DELTASONE) 10 MG tablet  Daily     03/25/20 1718           Jeral Pinch 03/26/20 1423    Derwood Kaplan, MD 03/27/20 [phone]

## 2020-11-19 ENCOUNTER — Encounter (HOSPITAL_COMMUNITY): Payer: Self-pay | Admitting: *Deleted

## 2020-11-19 ENCOUNTER — Other Ambulatory Visit: Payer: Self-pay

## 2020-11-19 ENCOUNTER — Emergency Department (HOSPITAL_COMMUNITY)
Admission: EM | Admit: 2020-11-19 | Discharge: 2020-11-20 | Disposition: A | Discharge status: Home / Self Care | Payer: Medicaid Other | Attending: Emergency Medicine | Admitting: Emergency Medicine

## 2020-11-19 DIAGNOSIS — M5441 Lumbago with sciatica, right side: Secondary | ICD-10-CM

## 2020-11-19 DIAGNOSIS — Z79899 Other long term (current) drug therapy: Secondary | ICD-10-CM | POA: Insufficient documentation

## 2020-11-19 DIAGNOSIS — F17210 Nicotine dependence, cigarettes, uncomplicated: Secondary | ICD-10-CM | POA: Insufficient documentation

## 2020-11-19 DIAGNOSIS — M5126 Other intervertebral disc displacement, lumbar region: Secondary | ICD-10-CM | POA: Insufficient documentation
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
## 2020-11-20 ENCOUNTER — Emergency Department (HOSPITAL_COMMUNITY): Payer: Medicaid Other

## 2020-11-20 LAB — I-STAT BETA HCG BLOOD, ED (MC, WL, AP ONLY)
I-stat hCG, quantitative: <5 m[IU]/mL (ref <5)
I-stat hCG, quantitative: <5 m[IU]/mL (ref <5)

## 2020-11-20 MED ORDER — METHYLPREDNISOLONE 4 MG PO TBPK: ORAL_TABLET | ORAL | 0 refills | Status: DC

## 2020-11-20 MED ORDER — HYDROCODONE-ACETAMINOPHEN 5-325 MG PO TABS
1.0000 | ORAL_TABLET | Freq: Once | ORAL | Status: AC
  Administered 2020-11-20: 1 via ORAL
  Filled 2020-11-20: qty 1

## 2020-11-20 NOTE — ED Notes (Signed)
Pt transported to CT ?

## 2020-11-20 NOTE — Discharge Instructions (Signed)
You may use over-the-counter Motrin (Ibuprofen), Acetaminophen (Tylenol), topical muscle creams such as SalonPas, Icy Hot, Bengay, etc. Please stretch, apply ice or heat (whichever helps), and have massage therapy for additional assistance.  
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
## 2020-11-20 NOTE — ED Notes (Signed)
Pt back from CT

## 2020-12-02 DIAGNOSIS — M5416 Radiculopathy, lumbar region: Secondary | ICD-10-CM | POA: Insufficient documentation

## 2021-03-08 ENCOUNTER — Encounter (HOSPITAL_COMMUNITY): Payer: Self-pay | Admitting: Emergency Medicine

## 2021-03-08 ENCOUNTER — Emergency Department (HOSPITAL_COMMUNITY)
Admission: EM | Admit: 2021-03-08 | Discharge: 2021-03-09 | Disposition: A | Discharge status: Home / Self Care | Payer: Medicaid Other | Source: Home / Self Care | Attending: Emergency Medicine | Admitting: Emergency Medicine

## 2021-03-08 ENCOUNTER — Other Ambulatory Visit: Payer: Self-pay

## 2021-03-08 ENCOUNTER — Emergency Department (HOSPITAL_COMMUNITY): Payer: Medicaid Other

## 2021-03-08 ENCOUNTER — Emergency Department (HOSPITAL_COMMUNITY)
Admission: EM | Admit: 2021-03-08 | Discharge: 2021-03-08 | Disposition: A | Discharge status: Home / Self Care | Payer: Medicaid Other | Attending: Emergency Medicine | Admitting: Emergency Medicine

## 2021-03-08 DIAGNOSIS — R1011 Right upper quadrant pain: Secondary | ICD-10-CM

## 2021-03-08 DIAGNOSIS — F17200 Nicotine dependence, unspecified, uncomplicated: Secondary | ICD-10-CM | POA: Insufficient documentation

## 2021-03-08 DIAGNOSIS — R1013 Epigastric pain: Secondary | ICD-10-CM | POA: Diagnosis not present

## 2021-03-08 DIAGNOSIS — K8020 Calculus of gallbladder without cholecystitis without obstruction: Secondary | ICD-10-CM

## 2021-03-08 DIAGNOSIS — R739 Hyperglycemia, unspecified: Secondary | ICD-10-CM | POA: Diagnosis not present

## 2021-03-08 DIAGNOSIS — R8290 Unspecified abnormal findings in urine: Secondary | ICD-10-CM | POA: Diagnosis not present

## 2021-03-08 DIAGNOSIS — R112 Nausea with vomiting, unspecified: Secondary | ICD-10-CM

## 2021-03-08 LAB — CBC
HCT: 41.6 % (ref 36.0–46.0)
HCT: 44.3 % (ref 36.0–46.0)
Hemoglobin: 14.1 g/dL (ref 12.0–15.0)
Hemoglobin: 15.3 g/dL — ABNORMAL HIGH (ref 12.0–15.0)
MCH: 31.1 pg (ref 26.0–34.0)
MCH: 31.7 pg (ref 26.0–34.0)
MCHC: 33.9 g/dL (ref 30.0–36.0)
MCHC: 34.5 g/dL (ref 30.0–36.0)
MCV: 91.6 fL (ref 80.0–100.0)
MCV: 91.9 fL (ref 80.0–100.0)
Platelets: 231 10*3/uL (ref 150–400)
Platelets: 281 10*3/uL (ref 150–400)
RBC: 4.54 MIL/uL (ref 3.87–5.11)
RBC: 4.82 MIL/uL (ref 3.87–5.11)
RDW: 11.7 % (ref 11.5–15.5)
RDW: 11.7 % (ref 11.5–15.5)
WBC: 13.5 10*3/uL — ABNORMAL HIGH (ref 4.0–10.5)
WBC: 15.8 10*3/uL — ABNORMAL HIGH (ref 4.0–10.5)
nRBC: 0 % (ref 0.0–0.2)
nRBC: 0 % (ref 0.0–0.2)

## 2021-03-08 LAB — URINALYSIS, ROUTINE W REFLEX MICROSCOPIC
Bilirubin Urine: NEGATIVE
Bilirubin Urine: NEGATIVE
Glucose, UA: >=500 mg/dL — AB
Glucose, UA: >=500 mg/dL — AB
Ketones, ur: 80 mg/dL — AB
Ketones, ur: 80 mg/dL — AB
Nitrite: NEGATIVE
Nitrite: NEGATIVE
Protein, ur: NEGATIVE mg/dL
Protein, ur: 30 mg/dL — AB
Specific Gravity, Urine: 1.035 — ABNORMAL HIGH (ref 1.005–1.030)
Specific Gravity, Urine: 1.042 — ABNORMAL HIGH (ref 1.005–1.030)
pH: 5 (ref 5.0–8.0)
pH: 6 (ref 5.0–8.0)

## 2021-03-08 LAB — COMPREHENSIVE METABOLIC PANEL
ALT: 33 U/L (ref 0–44)
ALT: 46 U/L — ABNORMAL HIGH (ref 0–44)
AST: 27 U/L (ref 15–41)
AST: 39 U/L (ref 15–41)
Albumin: 3.9 g/dL (ref 3.5–5.0)
Albumin: 4.2 g/dL (ref 3.5–5.0)
Alkaline Phosphatase: 88 U/L (ref 38–126)
Alkaline Phosphatase: 111 U/L (ref 38–126)
Anion gap: 13 (ref 5–15)
Anion gap: 15 (ref 5–15)
BUN: 12 mg/dL (ref 6–20)
BUN: 15 mg/dL (ref 6–20)
CO2: 21 mmol/L — ABNORMAL LOW (ref 22–32)
CO2: 20 mmol/L — ABNORMAL LOW (ref 22–32)
Calcium: 9.2 mg/dL (ref 8.9–10.3)
Calcium: 9.6 mg/dL (ref 8.9–10.3)
Chloride: 101 mmol/L (ref 98–111)
Chloride: 101 mmol/L (ref 98–111)
Creatinine, Ser: 0.56 mg/dL (ref 0.44–1.00)
Creatinine, Ser: 0.64 mg/dL (ref 0.44–1.00)
GFR, Estimated: >60 mL/min (ref >60)
GFR, Estimated: >60 mL/min (ref >60)
Glucose, Bld: 316 mg/dL — ABNORMAL HIGH (ref 70–99)
Glucose, Bld: 310 mg/dL — ABNORMAL HIGH (ref 70–99)
Potassium: 3.7 mmol/L (ref 3.5–5.1)
Potassium: 4.1 mmol/L (ref 3.5–5.1)
Sodium: 135 mmol/L (ref 135–145)
Sodium: 136 mmol/L (ref 135–145)
Total Bilirubin: 0.8 mg/dL (ref 0.3–1.2)
Total Bilirubin: 1 mg/dL (ref 0.3–1.2)
Total Protein: 8.2 g/dL — ABNORMAL HIGH (ref 6.5–8.1)
Total Protein: 8.5 g/dL — ABNORMAL HIGH (ref 6.5–8.1)

## 2021-03-08 LAB — LIPASE, BLOOD
Lipase: 24 U/L (ref 11–51)
Lipase: 28 U/L (ref 11–51)

## 2021-03-08 LAB — I-STAT BETA HCG BLOOD, ED (MC, WL, AP ONLY): I-stat hCG, quantitative: <5 m[IU]/mL (ref <5)

## 2021-03-08 LAB — CBG MONITORING, ED: Glucose-Capillary: 296 mg/dL — ABNORMAL HIGH (ref 70–99)

## 2021-03-08 MED ORDER — DIPHENHYDRAMINE HCL 50 MG/ML IJ SOLN
25.0000 mg | Freq: Once | INTRAMUSCULAR | Status: AC
  Administered 2021-03-08: 25 mg via INTRAVENOUS
  Filled 2021-03-08: qty 1

## 2021-03-08 MED ORDER — SODIUM CHLORIDE 0.9 % IV BOLUS
1000.0000 mL | Freq: Once | INTRAVENOUS | Status: AC
  Administered 2021-03-08: 1000 mL via INTRAVENOUS

## 2021-03-08 MED ORDER — MORPHINE SULFATE (PF) 4 MG/ML IV SOLN
4.0000 mg | Freq: Once | INTRAVENOUS | Status: AC
  Administered 2021-03-08: 4 mg via INTRAVENOUS
  Filled 2021-03-08: qty 1

## 2021-03-08 MED ORDER — CEPHALEXIN 500 MG PO CAPS: 500.0000 mg | ORAL_CAPSULE | Freq: Two times a day (BID) | ORAL | 0 refills | Status: DC

## 2021-03-08 MED ORDER — OXYCODONE-ACETAMINOPHEN 5-325 MG PO TABS: 1.0000 | ORAL_TABLET | ORAL | 0 refills | Status: DC | PRN

## 2021-03-08 MED ORDER — FAMOTIDINE IN NACL 20-0.9 MG/50ML-% IV SOLN
20.0000 mg | Freq: Once | INTRAVENOUS | Status: AC
  Administered 2021-03-09: 20 mg via INTRAVENOUS
  Filled 2021-03-08: qty 50

## 2021-03-08 MED ORDER — PROCHLORPERAZINE EDISYLATE 10 MG/2ML IJ SOLN
10.0000 mg | Freq: Once | INTRAMUSCULAR | Status: AC
  Administered 2021-03-08: 10 mg via INTRAVENOUS
  Filled 2021-03-08: qty 2

## 2021-03-08 MED ORDER — ONDANSETRON 4 MG PO TBDP: 4.0000 mg | ORAL_TABLET | Freq: Three times a day (TID) | ORAL | 0 refills | Status: DC | PRN

## 2021-03-08 MED ORDER — ONDANSETRON HCL 4 MG/2ML IJ SOLN
4.0000 mg | Freq: Once | INTRAMUSCULAR | Status: AC | PRN
  Administered 2021-03-08: 4 mg via INTRAVENOUS
  Filled 2021-03-08: qty 2

## 2021-03-08 MED ORDER — CEPHALEXIN 500 MG PO CAPS
500.0000 mg | ORAL_CAPSULE | Freq: Once | ORAL | Status: AC
  Administered 2021-03-08: 500 mg via ORAL
  Filled 2021-03-08: qty 1

## 2021-03-08 NOTE — ED Notes (Signed)
US at bedside

## 2021-03-08 NOTE — ED Triage Notes (Signed)
Patient reports continued N/V today. Seen last night for same. States cannot tolerate PO including prescribed medications.

## 2021-03-08 NOTE — Discharge Instructions (Signed)
Your ultrasound showed gallstones in your gallbladder.  Please follow-up with the general surgery group listed.  Your urinalysis showed signs of infection, I have given you an antibiotic for this.  Please take as directed.  Your blood sugar was high today.  This needs to be rechecked by your doctor, it is possible that you have undiagnosed diabetes.

## 2021-03-08 NOTE — ED Notes (Signed)
Care assumed. Pt moved from waiting room to ED 15

## 2021-03-08 NOTE — ED Provider Notes (Signed)
[facility]-EMERGENCY DEPT Provider Note   CSN: [id number] Arrival date & time: 03/08/21  2050     History Chief Complaint  Patient presents with  . Emesis    [name] is a 45 y.o. female.  45 yo F with a chief complaints of nausea vomiting and abdominal pain.  Patient was actually seen here yesterday with a similar complaint.  Had a right upper quadrant ultrasound that was consistent with cholelithiasis without acute cholecystitis.  Patient had symptomatically improved and was discharged home.  Today the patient states that she was not even able to drink water and had recurrent vomiting.  She did try the Zofran at home without improvement.  Denies any urinary symptoms denies fevers.  The history is provided by the patient.  Emesis Associated symptoms: abdominal pain   Associated symptoms: no arthralgias, no chills, no fever, no headaches and no myalgias   Illness Severity:  Moderate Onset quality:  Gradual Duration:  1 day Timing:  Constant Progression:  Worsening Chronicity:  New Associated symptoms: abdominal pain, nausea and vomiting   Associated symptoms: no chest pain, no congestion, no fever, no headaches, no myalgias, no rhinorrhea, no shortness of breath and no wheezing        History reviewed. No pertinent past medical history.  There are no problems to display for this patient.   Past Surgical History:  Procedure Laterality Date  . CESAREAN SECTION       OB History   No obstetric history on file.     Family History  Problem Relation Age of Onset  . Hypertension Mother     Social History   Tobacco Use  . Smoking status: Current Every Day Smoker    Packs/day: 1.50  . Smokeless tobacco: Never Used  Vaping Use  . Vaping Use: Never used  Substance Use Topics  . Alcohol use: Yes    Comment: socially  . Drug use: Yes    Types: Marijuana    Home Medications Prior to Admission medications   Medication Sig Start Date  End Date Taking? Authorizing Provider  promethazine (PHENERGAN) 25 MG suppository Place 1 suppository (25 mg total) rectally every 6 (six) hours as needed for nausea or vomiting. 03/09/21  Yes Melene Plan, DO  promethazine (PHENERGAN) 25 MG tablet Take 1 tablet (25 mg total) by mouth every 6 (six) hours as needed for nausea or vomiting. 03/09/21  Yes Melene Plan, DO  albuterol (VENTOLIN HFA) 108 (90 Base) MCG/ACT inhaler Inhale 2 puffs into the lungs every 4 (four) hours as needed for wheezing or shortness of breath. 03/25/20 04/24/20  Arlyn Dunning, PA-C  cephALEXin (KEFLEX) 500 MG capsule Take 1 capsule (500 mg total) by mouth 2 (two) times daily. 03/08/21   Roxy Horseman, PA-C  famotidine (PEPCID) 20 MG tablet Take 1 tablet (20 mg total) by mouth 2 (two) times daily. 01/09/20   Wallis Bamberg, PA-C  meloxicam (MOBIC) 7.5 MG tablet Take 1 tablet (7.5 mg total) by mouth daily. 01/09/20   Wallis Bamberg, PA-C  methylPREDNISolone (MEDROL DOSEPAK) 4 MG TBPK tablet Use as directed on the package 11/20/20   Cardama, Amadeo Garnet, MD  omeprazole (PRILOSEC) 20 MG capsule Take 1 capsule (20 mg total) by mouth daily. 01/09/20   Wallis Bamberg, PA-C  ondansetron (ZOFRAN ODT) 4 MG disintegrating tablet Take 1 tablet (4 mg total) by mouth every 8 (eight) hours as needed for nausea or vomiting. 03/08/21   Roxy Horseman, PA-C  oxyCODONE-acetaminophen (PERCOCET/ROXICET)  5-325 MG tablet Take 1-2 tablets by mouth every 4 (four) hours as needed for severe pain. May take 2 tablets PO q 6 hours for severe pain - Do not take with Tylenol as this tablet already contains tylenol 03/08/21   Roxy Horseman, PA-C    Allergies    Patient has no known allergies.  Review of Systems   Review of Systems  Constitutional: Negative for chills and fever.  HENT: Negative for congestion and rhinorrhea.   Eyes: Negative for redness and visual disturbance.  Respiratory: Negative for shortness of breath and wheezing.   Cardiovascular: Negative for  chest pain and palpitations.  Gastrointestinal: Positive for abdominal pain, nausea and vomiting.  Genitourinary: Negative for dysuria and urgency.  Musculoskeletal: Negative for arthralgias and myalgias.  Skin: Negative for pallor and wound.  Neurological: Negative for dizziness and headaches.    Physical Exam Updated Vital Signs BP (!) 116/58   Pulse 62   Temp 98.7 F (37.1 C) (Oral)   Resp 16   LMP  (LMP Unknown)   SpO2 99%   Physical Exam Vitals and nursing note reviewed.  Constitutional:      General: She is not in acute distress.    Appearance: She is well-developed. She is not diaphoretic.  HENT:     Head: Normocephalic and atraumatic.  Eyes:     Pupils: Pupils are equal, round, and reactive to light.  Cardiovascular:     Rate and Rhythm: Normal rate and regular rhythm.     Heart sounds: No murmur heard. No friction rub. No gallop.   Pulmonary:     Effort: Pulmonary effort is normal.     Breath sounds: No wheezing or rales.  Abdominal:     General: There is no distension.     Palpations: Abdomen is soft.     Tenderness: There is abdominal tenderness.     Comments: Pain worse to the epigastrium and right upper quadrant.  Negative Murphy sign.  Musculoskeletal:        General: No tenderness.     Cervical back: Normal range of motion and neck supple.  Skin:    General: Skin is warm and dry.  Neurological:     Mental Status: She is alert and oriented to person, place, and time.  Psychiatric:        Behavior: Behavior normal.     ED Results / Procedures / Treatments   Labs (all labs ordered are listed, but only abnormal results are displayed) Labs Reviewed  COMPREHENSIVE METABOLIC PANEL - Abnormal; Notable for the following components:      Result Value   CO2 20 (*)    Glucose, Bld 310 (*)    Total Protein 8.5 (*)    ALT 46 (*)    All other components within normal limits  CBC - Abnormal; Notable for the following components:   WBC 15.8 (*)     Hemoglobin 15.3 (*)    All other components within normal limits  URINALYSIS, ROUTINE W REFLEX MICROSCOPIC - Abnormal; Notable for the following components:   APPearance HAZY (*)    Specific Gravity, Urine 1.042 (*)    Glucose, UA >=500 (*)    Hgb urine dipstick SMALL (*)    Ketones, ur 80 (*)    Protein, ur 30 (*)    Leukocytes,Ua SMALL (*)    Bacteria, UA RARE (*)    All other components within normal limits  LIPASE, BLOOD  I-STAT BETA HCG BLOOD, ED ([facility], [facility],  [facility] ONLY)    EKG None  Radiology US Abdomen Limited  Result Date: 03/08/2021 CLINICAL DATA:  45 year old female with 1 day of right upper quadrant pain. EXAM: ULTRASOUND ABDOMEN LIMITED RIGHT UPPER QUADRANT COMPARISON:  Lumbar spine CT 11/20/2020. FINDINGS: Gallbladder: Gallbladder fills with sludge and shadowing stones (image 4). Individual stone size estimated up to 24 mm (image 23). Gallbladder wall thickness remains within normal limits, 1-2 mm (image 17). No pericholecystic fluid. No sonographic Murphy sign elicited. Common bile duct: Diameter: 4 mm, normal. Liver: No focal lesion identified. Within normal limits in parenchymal echogenicity. Portal vein is patent on color Doppler imaging with normal direction of blood flow towards the liver. Other: Negative visible right kidney. IMPRESSION: 1. Gallbladder is distended with sludge and multiple stones up to 2.4 cm each. But wall thickness remains normal and there is no strong evidence of acute cholecystitis at this time. 2. No evidence of bile duct obstruction. Electronically Signed   By: Odessa FlemingH  Hall M.D.   On: 03/08/2021 04:29   US Abdomen Limited RUQ (LIVER/GB)  Result Date: 03/08/2021 CLINICAL DATA:  Right upper quadrant pain EXAM: ULTRASOUND ABDOMEN LIMITED RIGHT UPPER QUADRANT COMPARISON:  Ultrasound earlier today FINDINGS: Gallbladder: Again noted are numerous stones and sludge filling the gallbladder. No wall thickening. Negative sonographic Murphy sign. No pericholecystic fluid.  Common bile duct: Diameter: Normal caliber, 4 mm Liver: No focal lesion identified. Within normal limits in parenchymal echogenicity. Portal vein is patent on color Doppler imaging with normal direction of blood flow towards the liver. Other: None. IMPRESSION: No change in the appearance of the gallbladder since earlier today with numerous stones and sludge filling the gallbladder. No convincing evidence for acute cholecystitis at this time. Electronically Signed   By: Charlett NoseKevin  Dover M.D.   On: 03/08/2021 23:56    Procedures Procedures   Medications Ordered in ED Medications  morphine 4 MG/ML injection 4 mg (4 mg Intravenous Given 03/08/21 2358)  prochlorperazine (COMPAZINE) injection 10 mg (10 mg Intravenous Given 03/08/21 2358)  diphenhydrAMINE (BENADRYL) injection 25 mg (25 mg Intravenous Given 03/08/21 2358)  sodium chloride 0.9 % bolus 1,000 mL (0 mLs Intravenous Stopped 03/09/21 0138)  famotidine (PEPCID) IVPB 20 mg premix (0 mg Intravenous Stopped 03/09/21 0032)    ED Course  I have reviewed the triage vital signs and the nursing notes.  Pertinent labs & imaging results that were available during my care of the patient were reviewed by me and considered in my medical decision making (see chart for details).    MDM Rules/Calculators/A&P                          45 yo F with a chief complaints of intractable nausea and vomiting and epigastric abdominal pain.  Going on for the past 2 days.  Was seen yesterday and was found to have gallstones without signs of acute cholecystitis.  Returned with recurrence and worsening symptoms.  Repeated ultrasound without change.  No LFT elevation.  Mild worsening leukocytosis.  Mild metabolic acidosis without anion gap.  Will treat the patient symptomatically.  Reassess.  Patient is feeling better on reassessment.  Repeat benign abdominal exam.  Follow-up with general surgery in the office.  1:52 AM:  I have discussed the diagnosis/risks/treatment options with  the patient and believe the pt to be eligible for discharge home to follow-up with PCP, [facility]. We also discussed returning to the ED immediately if new or worsening sx occur. We  discussed the sx which are most concerning (e.g., sudden worsening pain, fever, inability to tolerate by mouth) that necessitate immediate return. Medications administered to the patient during their visit and any new prescriptions provided to the patient are listed below.  Medications given during this visit Medications  morphine 4 MG/ML injection 4 mg (4 mg Intravenous Given 03/08/21 2358)  prochlorperazine (COMPAZINE) injection 10 mg (10 mg Intravenous Given 03/08/21 2358)  diphenhydrAMINE (BENADRYL) injection 25 mg (25 mg Intravenous Given 03/08/21 2358)  sodium chloride 0.9 % bolus 1,000 mL (0 mLs Intravenous Stopped 03/09/21 0138)  famotidine (PEPCID) IVPB 20 mg premix (0 mg Intravenous Stopped 03/09/21 0032)     The patient appears reasonably screen and/or stabilized for discharge and I doubt any other medical condition or other [facility] requiring further screening, evaluation, or treatment in the ED at this time prior to discharge.   Final Clinical Impression(s) / ED Diagnoses Final diagnoses:  RUQ abdominal pain  Nausea and vomiting in adult    Rx / DC Orders ED Discharge Orders         Ordered    promethazine (PHENERGAN) 25 MG tablet  Every 6 hours PRN        03/09/21 0150    promethazine (PHENERGAN) 25 MG suppository  Every 6 hours PRN        03/09/21 0150           Melene Plan, DO 03/09/21 [phone]

## 2021-03-08 NOTE — ED Notes (Signed)
Pt still not able to provide a urine specimen.

## 2021-03-08 NOTE — ED Notes (Signed)
Pt notified that we are requesting a urine specimen from her and she will provide a sample when ready.

## 2021-03-08 NOTE — ED Provider Notes (Signed)
[facility]-EMERGENCY DEPT Provider Note   CSN: [id number] Arrival date & time: 03/08/21  0002     History Chief Complaint  Patient presents with  . Abdominal Pain    [name] is a 45 y.o. female.  Patient presents to the emergency department with a chief complaint of right upper abdominal pain.  She states the symptoms started this morning.  She reports associated nausea and vomiting.  She states the pain is severe.  She denies any fevers or chills.  Denies any prior abdominal surgeries.  She denies having had any diarrhea.  EMS reports that her glucose was in the 300s.  Patient denies any history of diabetes.  The history is provided by the patient. No language interpreter was used.       No past medical history on file.  There are no problems to display for this patient.   Past Surgical History:  Procedure Laterality Date  . CESAREAN SECTION       OB History   No obstetric history on file.     Family History  Problem Relation Age of Onset  . Hypertension Mother     Social History   Tobacco Use  . Smoking status: Current Every Day Smoker    Packs/day: 1.50  . Smokeless tobacco: Never Used  Vaping Use  . Vaping Use: Never used  Substance Use Topics  . Alcohol use: Yes    Comment: socially  . Drug use: Yes    Types: Marijuana    Home Medications Prior to Admission medications   Medication Sig Start Date End Date Taking? Authorizing Provider  albuterol (VENTOLIN HFA) 108 (90 Base) MCG/ACT inhaler Inhale 2 puffs into the lungs every 4 (four) hours as needed for wheezing or shortness of breath. 03/25/20 04/24/20  Arlyn Dunning, PA-C  famotidine (PEPCID) 20 MG tablet Take 1 tablet (20 mg total) by mouth 2 (two) times daily. 01/09/20   Wallis Bamberg, PA-C  meloxicam (MOBIC) 7.5 MG tablet Take 1 tablet (7.5 mg total) by mouth daily. 01/09/20   Wallis Bamberg, PA-C  methylPREDNISolone (MEDROL DOSEPAK) 4 MG TBPK tablet Use as directed on the  package 11/20/20   Cardama, Amadeo Garnet, MD  omeprazole (PRILOSEC) 20 MG capsule Take 1 capsule (20 mg total) by mouth daily. 01/09/20   Wallis Bamberg, PA-C    Allergies    Patient has no known allergies.  Review of Systems   Review of Systems  All other systems reviewed and are negative.   Physical Exam Updated Vital Signs LMP  (LMP Unknown)   Physical Exam Vitals and nursing note reviewed.  Constitutional:      General: She is not in acute distress.    Appearance: She is well-developed.  HENT:     Head: Normocephalic and atraumatic.  Eyes:     Conjunctiva/sclera: Conjunctivae normal.  Cardiovascular:     Rate and Rhythm: Normal rate and regular rhythm.     Heart sounds: No murmur heard.   Pulmonary:     Effort: Pulmonary effort is normal. No respiratory distress.     Breath sounds: Normal breath sounds.  Abdominal:     Palpations: Abdomen is soft.     Tenderness: There is abdominal tenderness.     Comments: RUQ TTP  Musculoskeletal:        General: Normal range of motion.     Cervical back: Neck supple.  Skin:    General: Skin is warm and dry.  Neurological:  Mental Status: She is alert and oriented to person, place, and time.  Psychiatric:        Mood and Affect: Mood normal.        Behavior: Behavior normal.     ED Results / Procedures / Treatments   Labs (all labs ordered are listed, but only abnormal results are displayed) Labs Reviewed  LIPASE, BLOOD  COMPREHENSIVE METABOLIC PANEL  CBC  URINALYSIS, ROUTINE W REFLEX MICROSCOPIC  I-STAT BETA HCG BLOOD, ED ([facility], [facility], [facility] ONLY)    EKG None  Radiology No results found.  Procedures Procedures   Medications Ordered in ED Medications  ondansetron (ZOFRAN) injection 4 mg (has no administration in time range)  sodium chloride 0.9 % bolus 1,000 mL (has no administration in time range)  morphine 4 MG/ML injection 4 mg (has no administration in time range)    ED Course  I have reviewed the triage  vital signs and the nursing notes.  Pertinent labs & imaging results that were available during my care of the patient were reviewed by me and considered in my medical decision making (see chart for details).    MDM Rules/Calculators/A&P                          This patient complains of abdominal pain and vomiting, this involves an extensive number of treatment options, and is a complaint that carries with it a high risk of complications and morbidity.    Differential Dx Gastroenteritis, cholecystitis, pancreatitis, appendicitis, UTI  Pertinent Labs I ordered, reviewed, and interpreted labs, which included CBC notable for leukocytosis to 13.5, normal LFTs, normal electrolytes, glucose is 316, lipase is normal  Urinalysis is abnormal, question UTI.  Imaging Interpretation I ordered imaging studies which included right upper quadrant ultrasound, which showed gallstones and gallbladder sludge, but no evidence of acute cholecystitis.   Medications I ordered medication morphine, Zofran, fluids, Keflex for pain, nausea, questionable UTI.  Reassessments After the interventions stated above, I reevaluated the patient and found looking improved.  Consultants None  Plan Discharge with close outpatient follow-up.  Return precautions discussed.  Follow-up with general surgery regarding gallstones.  Follow-up with PCP.    Final Clinical Impression(s) / ED Diagnoses Final diagnoses:  Calculus of gallbladder without cholecystitis without obstruction  Abnormal urinalysis  Hyperglycemia    Rx / DC Orders ED Discharge Orders         Ordered    oxyCODONE-acetaminophen (PERCOCET/ROXICET) 5-325 MG tablet  Every 4 hours PRN        03/08/21 0458    ondansetron (ZOFRAN ODT) 4 MG disintegrating tablet  Every 8 hours PRN        03/08/21 0458    cephALEXin (KEFLEX) 500 MG capsule  2 times daily        03/08/21 0458           Roxy Horseman, PA-C 03/08/21 0503    Melene Plan,  DO 03/08/21 0505

## 2021-03-08 NOTE — ED Triage Notes (Signed)
Pt brought in by EMS from home, experiencing nausea and abdominal pain since 9AM today, has not eaten anything but crackers. Vomited twice, no diarrhea. 4 of Zofran did not help.

## 2021-03-08 NOTE — ED Provider Notes (Signed)
Patient placed in Quick Look pathway, seen and evaluated   Chief Complaint: Abdominal pain nausea and vomiting  HPI:   Patient with several days of abdominal pain, nausea and vomiting seen in the ED last night and was diagnosed with gallstones.  She states that prescribed medicines have not been helping.  She complains of epigastric and left upper quadrant abdominal pain with persistent vomiting.  No fever.  ROS: Nausea and vomiting  Physical Exam:   Gen: Appears uncomfortable, clutching vomit bag  Neuro: Awake and Alert  Skin: Warm    Focused Exam: Patient with tenderness to palpation in the epigastrium and left upper quadrant, less so in the right upper quadrant  BP (!) 162/102 (BP Location: Left Arm)   Pulse 90   Temp 98.7 F (37.1 C) (Oral)   Resp 20   LMP  (LMP Unknown)   SpO2 99%   Initiation of care has begun. The patient has been counseled on the process, plan, and necessity for staying for the completion/evaluation, and the remainder of the medical screening examination    Renne Crigler, Cordelia Poche 03/08/21 2122    Milagros Loll, MD 03/09/21 [phone]

## 2021-03-09 MED ORDER — PROMETHAZINE HCL 25 MG PO TABS: 25.0000 mg | ORAL_TABLET | Freq: Four times a day (QID) | ORAL | 0 refills | Status: DC | PRN

## 2021-03-09 MED ORDER — PROMETHAZINE HCL 25 MG RE SUPP: 25.0000 mg | Freq: Four times a day (QID) | RECTAL | 0 refills | Status: DC | PRN

## 2021-03-09 NOTE — Discharge Instructions (Signed)
Try pepcid or tagamet up to twice a day.  Try to avoid things that may make this worse, most commonly these are spicy foods tomato based products fatty foods chocolate and peppermint.  Alcohol and tobacco can also make this worse.  Return to the emergency department for sudden worsening pain fever or inability to eat or drink.  

## 2021-04-07 ENCOUNTER — Other Ambulatory Visit: Payer: Self-pay | Admitting: General Surgery

## 2021-04-07 ENCOUNTER — Encounter (HOSPITAL_COMMUNITY): Payer: Self-pay

## 2021-04-07 ENCOUNTER — Emergency Department (HOSPITAL_COMMUNITY): Payer: Medicaid Other

## 2021-04-07 ENCOUNTER — Inpatient Hospital Stay (HOSPITAL_COMMUNITY)
Admission: EM | Admit: 2021-04-07 | Discharge: 2021-04-10 | DRG: 419 | Disposition: A | Discharge status: Home / Self Care | Payer: Medicaid Other | Attending: Internal Medicine | Admitting: Internal Medicine

## 2021-04-07 DIAGNOSIS — Z720 Tobacco use: Secondary | ICD-10-CM

## 2021-04-07 DIAGNOSIS — Z20822 Contact with and (suspected) exposure to covid-19: Secondary | ICD-10-CM | POA: Diagnosis present

## 2021-04-07 DIAGNOSIS — E860 Dehydration: Secondary | ICD-10-CM | POA: Diagnosis present

## 2021-04-07 DIAGNOSIS — R9431 Abnormal electrocardiogram [ECG] [EKG]: Secondary | ICD-10-CM | POA: Diagnosis present

## 2021-04-07 DIAGNOSIS — K810 Acute cholecystitis: Secondary | ICD-10-CM

## 2021-04-07 DIAGNOSIS — E119 Type 2 diabetes mellitus without complications: Secondary | ICD-10-CM

## 2021-04-07 DIAGNOSIS — Z8249 Family history of ischemic heart disease and other diseases of the circulatory system: Secondary | ICD-10-CM

## 2021-04-07 DIAGNOSIS — K8012 Calculus of gallbladder with acute and chronic cholecystitis without obstruction: Principal | ICD-10-CM | POA: Diagnosis present

## 2021-04-07 DIAGNOSIS — Z791 Long term (current) use of non-steroidal anti-inflammatories (NSAID): Secondary | ICD-10-CM | POA: Diagnosis not present

## 2021-04-07 DIAGNOSIS — R739 Hyperglycemia, unspecified: Secondary | ICD-10-CM | POA: Diagnosis present

## 2021-04-07 DIAGNOSIS — K8010 Calculus of gallbladder with chronic cholecystitis without obstruction: Secondary | ICD-10-CM | POA: Diagnosis not present

## 2021-04-07 DIAGNOSIS — K819 Cholecystitis, unspecified: Secondary | ICD-10-CM

## 2021-04-07 DIAGNOSIS — R112 Nausea with vomiting, unspecified: Secondary | ICD-10-CM | POA: Diagnosis present

## 2021-04-07 DIAGNOSIS — Z79899 Other long term (current) drug therapy: Secondary | ICD-10-CM | POA: Diagnosis not present

## 2021-04-07 DIAGNOSIS — R1011 Right upper quadrant pain: Secondary | ICD-10-CM

## 2021-04-07 DIAGNOSIS — K8020 Calculus of gallbladder without cholecystitis without obstruction: Secondary | ICD-10-CM | POA: Diagnosis not present

## 2021-04-07 DIAGNOSIS — E876 Hypokalemia: Secondary | ICD-10-CM | POA: Diagnosis present

## 2021-04-07 DIAGNOSIS — F17210 Nicotine dependence, cigarettes, uncomplicated: Secondary | ICD-10-CM | POA: Diagnosis present

## 2021-04-07 DIAGNOSIS — A599 Trichomoniasis, unspecified: Secondary | ICD-10-CM | POA: Diagnosis present

## 2021-04-07 DIAGNOSIS — R109 Unspecified abdominal pain: Secondary | ICD-10-CM

## 2021-04-07 DIAGNOSIS — R001 Bradycardia, unspecified: Secondary | ICD-10-CM | POA: Diagnosis not present

## 2021-04-07 DIAGNOSIS — K219 Gastro-esophageal reflux disease without esophagitis: Secondary | ICD-10-CM | POA: Diagnosis present

## 2021-04-07 HISTORY — DX: Calculus of gallbladder without cholecystitis without obstruction: K80.20

## 2021-04-07 HISTORY — DX: Acute cholecystitis: K81.0

## 2021-04-07 HISTORY — DX: Gastro-esophageal reflux disease without esophagitis: K21.9

## 2021-04-07 HISTORY — DX: Tobacco use: Z72.0

## 2021-04-07 LAB — URINALYSIS, ROUTINE W REFLEX MICROSCOPIC
Bilirubin Urine: NEGATIVE
Glucose, UA: >=500 mg/dL — AB
Hgb urine dipstick: NEGATIVE
Ketones, ur: 80 mg/dL — AB
Nitrite: NEGATIVE
Protein, ur: 30 mg/dL — AB
Specific Gravity, Urine: 1.04 — ABNORMAL HIGH (ref 1.005–1.030)
pH: 6 (ref 5.0–8.0)

## 2021-04-07 LAB — CBC WITH DIFFERENTIAL/PLATELET
Abs Immature Granulocytes: 0.04 K/uL (ref 0.00–0.07)
Basophils Absolute: 0.1 K/uL (ref 0.0–0.1)
Basophils Relative: 1 %
Eosinophils Absolute: 0.1 K/uL (ref 0.0–0.5)
Eosinophils Relative: 1 %
HCT: 45.3 % (ref 36.0–46.0)
Hemoglobin: 15.6 g/dL — ABNORMAL HIGH (ref 12.0–15.0)
Immature Granulocytes: 1 %
Lymphocytes Relative: 33 %
Lymphs Abs: 2.9 K/uL (ref 0.7–4.0)
MCH: 31.3 pg (ref 26.0–34.0)
MCHC: 34.4 g/dL (ref 30.0–36.0)
MCV: 91 fL (ref 80.0–100.0)
Monocytes Absolute: 0.4 K/uL (ref 0.1–1.0)
Monocytes Relative: 4 %
Neutro Abs: 5.4 K/uL (ref 1.7–7.7)
Neutrophils Relative %: 60 %
Platelets: 261 K/uL (ref 150–400)
RBC: 4.98 MIL/uL (ref 3.87–5.11)
RDW: 11.6 % (ref 11.5–15.5)
WBC: 8.8 K/uL (ref 4.0–10.5)
nRBC: 0 % (ref 0.0–0.2)

## 2021-04-07 LAB — COMPREHENSIVE METABOLIC PANEL WITH GFR
ALT: 36 U/L (ref 0–44)
AST: 33 U/L (ref 15–41)
Albumin: 4.1 g/dL (ref 3.5–5.0)
Alkaline Phosphatase: 80 U/L (ref 38–126)
Anion gap: 14 (ref 5–15)
BUN: 10 mg/dL (ref 6–20)
CO2: 22 mmol/L (ref 22–32)
Calcium: 9.5 mg/dL (ref 8.9–10.3)
Chloride: 100 mmol/L (ref 98–111)
Creatinine, Ser: 0.67 mg/dL (ref 0.44–1.00)
GFR, Estimated: >60 mL/min
Glucose, Bld: 377 mg/dL — ABNORMAL HIGH (ref 70–99)
Potassium: 3.4 mmol/L — ABNORMAL LOW (ref 3.5–5.1)
Sodium: 136 mmol/L (ref 135–145)
Total Bilirubin: 0.9 mg/dL (ref 0.3–1.2)
Total Protein: 8.3 g/dL — ABNORMAL HIGH (ref 6.5–8.1)

## 2021-04-07 LAB — RESP PANEL BY RT-PCR (FLU A&B, COVID) ARPGX2
Influenza A by PCR: NEGATIVE
Influenza B by PCR: NEGATIVE
SARS Coronavirus 2 by RT PCR: NEGATIVE

## 2021-04-07 LAB — I-STAT BETA HCG BLOOD, ED (MC, WL, AP ONLY): I-stat hCG, quantitative: <5 m[IU]/mL

## 2021-04-07 LAB — MAGNESIUM: Magnesium: 1.9 mg/dL (ref 1.7–2.4)

## 2021-04-07 LAB — CBG MONITORING, ED: Glucose-Capillary: 326 mg/dL — ABNORMAL HIGH (ref 70–99)

## 2021-04-07 LAB — LIPASE, BLOOD: Lipase: 32 U/L (ref 11–51)

## 2021-04-07 MED ORDER — ONDANSETRON HCL 4 MG/2ML IJ SOLN
4.0000 mg | Freq: Once | INTRAMUSCULAR | Status: AC
  Administered 2021-04-07: 4 mg via INTRAVENOUS
  Filled 2021-04-07: qty 2

## 2021-04-07 MED ORDER — IOHEXOL 300 MG/ML  SOLN: 100.0000 mL | Freq: Once | INTRAMUSCULAR | Status: DC | PRN

## 2021-04-07 MED ORDER — IOHEXOL 300 MG/ML  SOLN
100.0000 mL | Freq: Once | INTRAMUSCULAR | Status: AC | PRN
  Administered 2021-04-07: 100 mL via INTRAVENOUS

## 2021-04-07 MED ORDER — FENTANYL CITRATE (PF) 100 MCG/2ML IJ SOLN
50.0000 ug | Freq: Once | INTRAMUSCULAR | Status: AC
  Administered 2021-04-07: 50 ug via INTRAVENOUS
  Filled 2021-04-07: qty 2

## 2021-04-07 MED ORDER — SODIUM CHLORIDE 0.9 % IV BOLUS
1000.0000 mL | Freq: Once | INTRAVENOUS | Status: AC
  Administered 2021-04-07: 1000 mL via INTRAVENOUS

## 2021-04-07 MED ORDER — LORAZEPAM 2 MG/ML IJ SOLN: 0.5000 mg | Freq: Four times a day (QID) | INTRAMUSCULAR | Status: DC | PRN

## 2021-04-07 MED ORDER — MORPHINE SULFATE (PF) 4 MG/ML IV SOLN
4.0000 mg | Freq: Once | INTRAVENOUS | Status: AC
Start: 2021-04-07 — End: 2021-04-07
  Administered 2021-04-07: 4 mg via INTRAVENOUS
  Filled 2021-04-07: qty 1

## 2021-04-07 MED ORDER — ONDANSETRON 4 MG PO TBDP
4.0000 mg | ORAL_TABLET | Freq: Once | ORAL | Status: AC
  Administered 2021-04-07: 4 mg via ORAL
  Filled 2021-04-07: qty 1

## 2021-04-07 MED ORDER — NALOXONE HCL 0.4 MG/ML IJ SOLN: 0.4000 mg | INTRAMUSCULAR | Status: DC | PRN

## 2021-04-07 MED ORDER — ONDANSETRON 4 MG PO TBDP
4.0000 mg | ORAL_TABLET | Freq: Once | ORAL | Status: DC
  Filled 2021-04-07: qty 1

## 2021-04-07 MED ORDER — LORAZEPAM 2 MG/ML IJ SOLN
0.5000 mg | Freq: Once | INTRAMUSCULAR | Status: AC
  Administered 2021-04-07: 0.5 mg via INTRAVENOUS
  Filled 2021-04-07: qty 1

## 2021-04-07 MED ORDER — LACTATED RINGERS IV SOLN: INTRAVENOUS | Status: DC

## 2021-04-07 MED ORDER — HYDROMORPHONE HCL 1 MG/ML IJ SOLN
0.5000 mg | INTRAMUSCULAR | Status: DC | PRN
Start: 2021-04-07 — End: 2021-04-09

## 2021-04-07 MED ORDER — POTASSIUM CHLORIDE 10 MEQ/100ML IV SOLN
10.0000 meq | INTRAVENOUS | Status: AC
  Administered 2021-04-08 (×4): 10 meq via INTRAVENOUS
  Filled 2021-04-07 (×4): qty 100

## 2021-04-07 MED ORDER — ONDANSETRON HCL 4 MG/2ML IJ SOLN
4.0000 mg | Freq: Four times a day (QID) | INTRAMUSCULAR | Status: DC | PRN
  Administered 2021-04-09: 4 mg via INTRAVENOUS
  Filled 2021-04-07: qty 2

## 2021-04-07 MED ORDER — INSULIN ASPART 100 UNIT/ML IJ SOLN
0.0000 [IU] | Freq: Four times a day (QID) | INTRAMUSCULAR | Status: DC
  Administered 2021-04-07: 7 [IU] via SUBCUTANEOUS
  Administered 2021-04-08: 3 [IU] via SUBCUTANEOUS
  Administered 2021-04-08: 5 [IU] via SUBCUTANEOUS
  Administered 2021-04-09: 3 [IU] via SUBCUTANEOUS
  Administered 2021-04-09: 2 [IU] via SUBCUTANEOUS
  Administered 2021-04-09: 7 [IU] via SUBCUTANEOUS
  Administered 2021-04-09: 3 [IU] via SUBCUTANEOUS
  Administered 2021-04-09: 5 [IU] via SUBCUTANEOUS
  Administered 2021-04-10 (×2): 2 [IU] via SUBCUTANEOUS
  Filled 2021-04-07: qty 0.09

## 2021-04-07 NOTE — Progress Notes (Signed)
Brief note regarding plan, with full H&P to follow:  45 year old female who is admitted with acute cholecystitis after presenting from home to [facility] long emergency department complaining of progressive abdominal pain associated with nausea resulting in 3-4 episodes of nonbloody, nonbilious emesis.  The patient reportedly was recently diagnosed with acute cholecystitis, which has has been managed conservatively by [facility] surgical group, with their reported plan to perform cholecystectomy over the next several days.  However, in the setting of worsening pain associated with poor outpatient pain control complicated by antibiotic inability to tolerate p.o., the patient's case was discussed with the on-call general surgeon, Dr. Sheliah Hatch, who has agreed to formally consult on the patient, and anticipates taking the patient to the OR tomorrow for cholecystectomy.  Patient has been made n.p.o.  On IV fluids.  As needed IV Dilaudid.  As needed Zofran.  Supplementing mild hypokalemia.  Additionally, the patient has evidence of hyperglycemia in the absence of any known underlying diagnosis of diabetes.  Per chart review, it appears that she has had episodes of prior hyperglycemia as an outpatient.  As she is insulin nave, will start low-dose sliding scale insulin, and closely trend ensuing point-of-care glucose values, with possibility of initiating insulin drip if these measures are not sufficient.  No evidence of DKA.     Newton Pigg, DO Hospitalist

## 2021-04-07 NOTE — ED Notes (Signed)
Transport called to take pt to the floor 

## 2021-04-07 NOTE — ED Provider Notes (Signed)
Emergency Medicine Provider Triage Evaluation Note  [name] 45 y.o. female was evaluated in triage.  Pt complains of upper abdominal pain that started today.  Patient with history of gallstones and has been following with her doctor.  She has not had surgical intervention.  She reports that today, she started having pain in her upper abdomen associate with nausea/vomiting.  No fever.  Review of Systems  Positive: abd pain, n/v Negative: fever  Physical Exam  BP 134/82   Pulse 70   Temp 98.2 F (36.8 C) (Oral)   Resp 18   Ht 5\' 4"  (1.626 m)   Wt 65.8 kg   SpO2 100%   BMI 24.89 kg/m  Gen:   Awake, appears uncomfortable but NAD HEENT:  Atraumatic  Resp:  Normal effort  Cardiac:  Normal rate  Abd:   Nondistended, ttp to RUQ no rigidity.  MSK:   Moves extremities without difficulty  Neuro:  Speech clear   Medical Decision Making  Medically screening exam initiated at 3:55 AM.  Appropriate orders placed.  Etter R Petion was informed that the remainder of the evaluation will be completed by another provider, this initial triage assessment does not replace that evaluation, and the importance of remaining in the ED until their evaluation is complete.    Clinical Impression  abd pain   Portions of this note were generated with Dragon dictation software. Dictation errors may occur despite best attempts at proofreading.     Lindie Spruce, PA-C 04/07/21 1437    06/07/21, MD 04/08/21 1540

## 2021-04-07 NOTE — ED Triage Notes (Signed)
Pt presents with c/o abdominal pain and vomiting. Pt has a hx of gallstones, has been having several attacks of this nature. Pt has been following up with her GI MD, may need surgery at this point. Pt's CBG was 373 for EMS, pt is not diabetic.

## 2021-04-07 NOTE — H&P (Signed)
History and Physical    PLEASE NOTE THAT DRAGON DICTATION SOFTWARE WAS USED IN THE CONSTRUCTION OF THIS NOTE.   [name]:[id number] DOB: 05-09-1976 DOA: 04/07/2021  PCP: Patient, No Pcp Per (Inactive) Patient coming from: home   I have personally briefly reviewed patient's old medical records in [facility]  Chief Complaint: abdominal pain  HPI: [name] is a 45 y.o. female with medical history significant for cholelithiasis , recurrent biliary colic, GERD, who is admitted to [facility] on 04/07/2021 with suspected acute cholecystitis after presenting from home to [facility] ED complaining of abdominal pain.   The following history is provided via messages with the patient, discussed with the emergency department physician, and via chart review.  In the context of a history of cholelithiasis and recurrent biliary colic, the patient has been following with [facility] as an outpatient.  In spite of compliance with conservative measures recommended by [facility], the patient has been experiencing gradual progression of the intensity of her right upper quadrant pain prompting her to follow-up with this group earlier in the week at which time it was determined that she would undergo scheduled cholecystectomy next week.   However, in the interval following establishment of this plan for surgery, patient reports that her right upper quadrant abdominal pain has significantly intensified over the last 2 days, noting much worse intensity as well as increasing frequency of the associated discomfort.  She also notes associated nausea on intermittent basis over that timeframe, resulting in at least 5-6 episodes of nonbloody, nonbilious emesis resulting in her reported inability to tolerate p.o., including unable to tolerate oral antiemetic or analgesic interventions.  She describes her abdominal pain as sharp, predominantly in the right upper quadrant and  epigastrium, without radiation from the site.  Over the last 2 days, this discomfort is progressed from intermittent to near constant, with exacerbation of the intensity associated with this via palpation over the area with her attempts to eat or drink anything.  Denies any associated subjective fever, chills, rigors, or generalized myalgias.  She reports 1 episode of watery, nonbloody loose stool over the last day.   Denies any associated chest pain, shortness of breath palpitations, diaphoresis, dizziness, presyncope, or syncope.  She also denies any associated headache, neck stiffness, wheezing, cough, or rash.  No recent traveling or known COVID-19 exposures.  Denies any acute urinary symptoms, including no recent dysuria, gross hematuria, or change in urinary urgency/frequency.  She confirms that she is on no blood thinners as an outpatient, including no aspirin.  Of note, the patient conveys that she has a history of intermittent hyperglycemia in the absence of any diabetes.  Not on any insulin or oral hypoglycemic medications, including metformin, as an outpatient.      ED Course:  Vital signs in the ED were notable for the following: Tetramex 98.0, heart rate 58-85; blood pressure 139/81 -148/90; respiratory rate 17-18, oxygen saturation 97 to 100% on room air.  Labs were notable for the following: CMP was notable for the following: Sodium 136, potassium 3.4, bicarbonate 22, anion gap 14, creatinine 0.67, glucose 377, and liver enzymes were found to be within normal limits.  Lipase 32.  CBC notable for white blood cell count of 8800.  Urinalysis showed no white blood cells, nitrate negative, but was notable for specific gravity 1.040.  Nasopharyngeal COVID-19/influenza PCR were checked in the ED today and found to be negative.  EKG showed sinus rhythm with  heart rate 76, borderline QTC prolongation of 493 MS, and no evidence of T wave or ST changes, including no evidence of ST elevation.   Abdominal ultrasound showed gallbladder filled with sludge and stones with positive sonographic Murphy sign, in the absence of common bile duct dilation or radiopaque choledocholithiasis.  The patient's case and imaging were discussed with the on-call general surgeon, Dr Sheliah HatchKinsinger, Who agreed to formally consult, with plan for cholecystectomy in the morning (04/08/21).   While in the ED, the following were administered: Fentanyl 50 mcg IV x2, morphine 4 mg IV x1, Zofran 4 mg IV x3, and a 1 L normal saline bolus.     Review of Systems: As per HPI otherwise 10 point review of systems negative.   Past Medical History:  Diagnosis Date  . Cholelithiasis   . GERD (gastroesophageal reflux disease)   . Tobacco abuse     Past Surgical History:  Procedure Laterality Date  . CESAREAN SECTION      Social History:  reports that she has been smoking. She has been smoking about 1.50 packs per day. She has never used smokeless tobacco. She reports current alcohol use. She reports current drug use. Drug: Marijuana.   No Known Allergies  Family History  Problem Relation Age of Onset  . Hypertension Mother      Prior to Admission medications   Medication Sig Start Date End Date Taking? Authorizing Provider  famotidine (PEPCID) 20 MG tablet Take 1 tablet (20 mg total) by mouth 2 (two) times daily. 01/09/20  Yes Wallis BambergMani, Mario, PA-C  albuterol (VENTOLIN HFA) 108 (90 Base) MCG/ACT inhaler Inhale 2 puffs into the lungs every 4 (four) hours as needed for wheezing or shortness of breath. Patient not taking: Reported on 04/07/2021 03/25/20 04/24/20  Ronnie DossMcLean, Kelly A, PA-C  cephALEXin (KEFLEX) 500 MG capsule Take 1 capsule (500 mg total) by mouth 2 (two) times daily. Patient not taking: No sig reported 03/08/21   Roxy HorsemanBrowning, Robert, PA-C  meloxicam (MOBIC) 7.5 MG tablet Take 1 tablet (7.5 mg total) by mouth daily. Patient not taking: No sig reported 01/09/20   Wallis BambergMani, Mario, PA-C  methylPREDNISolone (MEDROL DOSEPAK) 4  MG TBPK tablet Use as directed on the package Patient not taking: No sig reported 11/20/20   Cardama, Amadeo GarnetPedro Eduardo, MD  omeprazole (PRILOSEC) 20 MG capsule Take 1 capsule (20 mg total) by mouth daily. Patient not taking: No sig reported 01/09/20   Wallis BambergMani, Mario, PA-C  ondansetron (ZOFRAN ODT) 4 MG disintegrating tablet Take 1 tablet (4 mg total) by mouth every 8 (eight) hours as needed for nausea or vomiting. Patient not taking: No sig reported 03/08/21   Roxy HorsemanBrowning, Robert, PA-C  oxyCODONE-acetaminophen (PERCOCET/ROXICET) 5-325 MG tablet Take 1-2 tablets by mouth every 4 (four) hours as needed for severe pain. May take 2 tablets PO q 6 hours for severe pain - Do not take with Tylenol as this tablet already contains tylenol Patient not taking: No sig reported 03/08/21   Roxy HorsemanBrowning, Robert, PA-C  promethazine (PHENERGAN) 25 MG suppository Place 1 suppository (25 mg total) rectally every 6 (six) hours as needed for nausea or vomiting. Patient not taking: No sig reported 03/09/21   Melene PlanFloyd, Dan, DO  promethazine (PHENERGAN) 25 MG tablet Take 1 tablet (25 mg total) by mouth every 6 (six) hours as needed for nausea or vomiting. Patient not taking: No sig reported 03/09/21   Melene PlanFloyd, Dan, DO     Objective    Physical Exam: Vitals:   04/07/21 1830  04/07/21 1845 04/07/21 1900 04/07/21 2045  BP: (!) 170/99  137/81 (!) 141/94  Pulse: 74 72 64 68  Resp: 18  17 18   Temp:      TempSrc:      SpO2: 100%  99% 99%    General: appears to be stated age; alert, oriented Skin: warm, dry, no rash Head:  AT/[place] Mouth:  Oral mucosa membranes appear dry, normal dentition Neck: supple; trachea midline Heart:  RRR; did not appreciate any M/R/G Lungs: CTAB, did not appreciate any wheezes, rales, or rhonchi Abdomen: + BS; soft, ND; tender to palpation of RUQ and epigastrum , in the absence of any associated guarding , rigidity , or rebound tenderness.  Vascular: 2+ pedal pulses b/l; 2+ radial pulses b/l Extremities: no  peripheral edema, no muscle wasting Neuro: strength and sensation intact in upper and lower extremities b/l     Labs on Admission: I have personally reviewed following labs and imaging studies  CBC: Recent Labs  Lab 04/07/21 1501  WBC 8.8  NEUTROABS 5.4  HGB 15.6*  HCT 45.3  MCV 91.0  PLT 261   Basic Metabolic Panel: Recent Labs  Lab 04/07/21 1501  NA 136  K 3.4*  CL 100  CO2 22  GLUCOSE 377*  BUN 10  CREATININE 0.67  CALCIUM 9.5   GFR: CrCl cannot be calculated (Unknown ideal weight.). Liver Function Tests: Recent Labs  Lab 04/07/21 1501  AST 33  ALT 36  ALKPHOS 80  BILITOT 0.9  PROT 8.3*  ALBUMIN 4.1   Recent Labs  Lab 04/07/21 1501  LIPASE 32   No results for input(s): AMMONIA in the last 168 hours. Coagulation Profile: No results for input(s): INR, PROTIME in the last 168 hours. Cardiac Enzymes: No results for input(s): CKTOTAL, CKMB, CKMBINDEX, TROPONINI in the last 168 hours. BNP (last 3 results) No results for input(s): PROBNP in the last 8760 hours. HbA1C: No results for input(s): HGBA1C in the last 72 hours. CBG: Recent Labs  Lab 04/07/21 2132  GLUCAP 326*   Lipid Profile: No results for input(s): CHOL, HDL, LDLCALC, TRIG, CHOLHDL, LDLDIRECT in the last 72 hours. Thyroid Function Tests: No results for input(s): TSH, T4TOTAL, FREET4, T3FREE, THYROIDAB in the last 72 hours. Anemia Panel: No results for input(s): VITAMINB12, FOLATE, FERRITIN, TIBC, IRON, RETICCTPCT in the last 72 hours. Urine analysis:    Component Value Date/Time   COLORURINE YELLOW 04/07/2021 1606   APPEARANCEUR CLEAR 04/07/2021 1606   LABSPEC 1.040 (H) 04/07/2021 1606   PHURINE 6.0 04/07/2021 1606   GLUCOSEU >=500 (A) 04/07/2021 1606   HGBUR NEGATIVE 04/07/2021 1606   BILIRUBINUR NEGATIVE 04/07/2021 1606   KETONESUR 80 (A) 04/07/2021 1606   PROTEINUR 30 (A) 04/07/2021 1606   UROBILINOGEN 1.0 07/01/2009 0722   NITRITE NEGATIVE 04/07/2021 1606   LEUKOCYTESUR  SMALL (A) 04/07/2021 1606    Radiological Exams on Admission: CT Abdomen Pelvis W Contrast  Result Date: 04/07/2021 CLINICAL DATA:  Left upper quadrant abdominal pain EXAM: CT ABDOMEN AND PELVIS WITH CONTRAST TECHNIQUE: Multidetector CT imaging of the abdomen and pelvis was performed using the standard protocol following bolus administration of intravenous contrast. CONTRAST:  06/07/2021 OMNIPAQUE IOHEXOL 300 MG/ML SOLN, <See Chart> OMNIPAQUE IOHEXOL 300 MG/ML SOLN COMPARISON:  Abdominal ultrasound 04/07/2021 FINDINGS: The patient was apparently unable to raise the right arm during imaging, resulting in mild reduction of signal to noise ratio. Lower chest: Linear subsegmental atelectasis or scarring in the right middle lobe and posterior basal segment of the  left lower lobe. Mild distal esophageal wall thickening, esophagitis would be the most common cause. Hepatobiliary: Multiple gallstones in the gallbladder likely with sludge in the gallbladder as well. No pericholecystic fluid identified. Focal steatosis in segment 4b of the liver adjacent to the falciform ligament. Pancreas: Unremarkable Spleen: Unremarkable Adrenals/Urinary Tract: Unremarkable Stomach/Bowel: Unremarkable.  Normal appendix.  No dilated bowel. Vascular/Lymphatic: Aortoiliac atherosclerotic vascular disease. No pathologic adenopathy. Reproductive: Unremarkable Other: No supplemental non-categorized findings. Musculoskeletal: Unremarkable IMPRESSION: 1. Cholelithiasis. 2. Wall thickening in the distal esophagus, esophagitis would be the most common cause. 3.  Aortic Atherosclerosis (ICD10-I70.0). 4. No specific left upper quadrant finding to fully explain the patient's abdominal pain. Electronically Signed   By: Gaylyn Rong M.D.   On: 04/07/2021 20:22   US Abdomen Limited RUQ (LIVER/GB)  Result Date: 04/07/2021 CLINICAL DATA:  Abdominal pain. EXAM: ULTRASOUND ABDOMEN LIMITED RIGHT UPPER QUADRANT COMPARISON:  Ultrasound, 03/08/2021.  FINDINGS: Gallbladder: Gallbladder is filled with stones and sludge. No wall thickening or pericholecystic fluid. Positive sonographic Murphy's sign. Common bile duct: Diameter: 4 mm. Liver: No focal lesion identified. Within normal limits in parenchymal echogenicity. Portal vein is patent on color Doppler imaging with normal direction of blood flow towards the liver. Other: None. IMPRESSION: 1. Gallbladder filled with sludge and stones with a positive sonographic Murphy sign, but no wall thickening or pericholecystic fluid. This appearance is stable from the prior study. No convincing acute cholecystitis. 2. Normal liver.  No bile duct dilation. Electronically Signed   By: Amie Portland M.D.   On: 04/07/2021 16:07     EKG: Independently reviewed, with result as described above.    Assessment/Plan   [name] is a 45 y.o. female with medical history significant for cholelithiasis , recurrent biliary colic, GERD, who is admitted to [facility] on 04/07/2021 with suspected acute cholecystitis after presenting from home to [facility] ED complaining of abdominal pain.    Principal Problem:   Cholecystitis Active Problems:   Abdominal pain   Hypokalemia   Hyperglycemia   Dehydration   GERD (gastroesophageal reflux disease)   Tobacco abuse   Nausea & vomiting    #) Acute cholecystitis: In the setting of known cholelithiasis and progressive, recurrent biliary colic, with plan for outpatient cholecystectomy next week, the patient presents with 2 days of significantly worsening abdominal pain associated with nausea/vomiting rendering her unable to tolerate p.o. as an outpatient, complicating ability to obtain adequate pain control in the interval, tonight's ultrasound of the abdomen demonstrating evidence of a gallbladder that is filled with sludge and stones and associated with a positive radiographic Murphy sign, in the absence of any evidence of common bile duct dilation or radiopaque  choledocholithiasis.  Consistent with these radiographic findings, there is no evidence of cholestatic pattern on presenting labs to suggest a radiolucent choledocholithiasis.  Of note, no clinical evidence to suggest ascending cholangitis.  Patient does not appear septic, does not meet SIRS criteria. The patient's case and imaging were discussed with the on-call general surgeon, Dr Sheliah Hatch, Who agreed to formally consult, with plan for cholecystectomy in the morning (04/08/21).   Presenting EKG showed sinus rhythm with no evidence of acute ischemic changes.  No clinical evidence to suggest acutely decompensated heart failure.  Consequently, there are no absolute contraindications to proceeding with cholecystectomy as proposed by general surgery.   Plan: NPO.  General surgery formally consulted, as above, with plan for cholecystectomy in the morning.  As needed IV Dilaudid.  As needed IV Ativan  for nausea.  Check INR.  Repeat CMP and CBC in the morning.  Lactated Ringer's at 100 cc/h.    #)Hypokalemia: Mildly low serum potassium level of 3.4 in the setting of recurrent nausea/vomiting in corresponding inability to tolerate p.o..   Plan: Potassium chloride 40 equivalents IV over 4 hours x 1 now.  Add on serum magnesium level.  Repeat CMP in the morning     #) Hyperglycemia: In the absence of any formal diagnosis of underlying diabetes, the patient reports she has been told in the throughout adulthood that her blood sugars occasionally run high.  In this context, presenting blood sugar noted to be 377 in the absence of any anion gap metabolic acidosis to suggest DKA.  Does not meet quantitative threshold for HH NK.  Suspect a degree of insulin resistance, with hyperglycemic exacerbation in setting of physiologic stress relating to acute cholecystitis and associated suboptimal pain control.  Will assess for diabetes with hemoglobin A1c.   Plan: Check hemoglobin A1c.  Accu-Cheks every 6 hours with  low-dose sliding scale insulin.  Lactated Ringer's on a continuous basis, as above.     #) Dehydration: Clinical appearance of dehydration, including dry oral mucous membranes as well as urinalysis demonstrating evidence of elevated specific gravity.  Is in the setting of recent increase in GI losses given recent nausea/vomiting and corresponding decline in oral intake over the last couple days. no evidence of associated hypotension or acute kidney injury.  Plan: Continuous lactated Ringer's, as above.  Monitor strict I's and O's Daily weights.  Repeat CMP in the morning.  As needed IV Ativan for residual nausea.      #) GERD: On famotidine as an outpatient.  Plan: In the setting of current n.p.o. status, will hold home famotidine.  As needed IV Protonix.     #) Chronic tobacco abuse: The patient confirms that she is a current smoker, having smoked 1 to 1.5 packs/day over at least the last 10 years.  Plan: Counseled the patient on the importance of complete smoking discontinuation.      DVT prophylaxis: SCDs Code Status: Full code Family Communication: none Disposition Plan: Per Rounding Team Consults called: Patient's case SLJ was discussed with the on-call general surgeon, Dr. Sheliah Hatch, as further described above. Admission status: Inpatient; MedSurg.     Of note, this patient was added by me to the following Admit List/Treatment Team: [email or web address].      PLEASE NOTE THAT DRAGON DICTATION SOFTWARE WAS USED IN THE CONSTRUCTION OF THIS NOTE.   Angie Fava DO [facility] Pager [phone] From 6PM - 2AM  Otherwise, please contact night-coverage  [email or web address] Password [email or web address]   04/07/2021, 10:12 PM

## 2021-04-07 NOTE — ED Provider Notes (Signed)
[facility]-EMERGENCY DEPT Provider Note   CSN: [id number] Arrival date & time: 04/07/21  1429     History Chief Complaint  Patient presents with  . Emesis  . Abdominal Pain    [name] is a 45 y.o. female patient with history of cholelithiasis without acute cholecystitis who presents with recurring biliary colic, nausea, vomiting.  Additionally today has had 3 episodes of watery diarrhea without melena or hematochezia.  Patient was seen by GI this morning who plan to schedule her for cholecystectomy in the next few weeks, however began to have worsening right upper quadrant pain, nausea, vomiting since seeing GI this morning.  Denies any fevers or chills at home.  Nausea unrelieved by Zofran.  I personally read this patient's medical records.  Has history of cholelithiasis and GERD.  HPI     History reviewed. No pertinent past medical history.  Patient Active Problem List   Diagnosis Date Noted  . Cholecystitis 04/07/2021    Past Surgical History:  Procedure Laterality Date  . CESAREAN SECTION       OB History   No obstetric history on file.     Family History  Problem Relation Age of Onset  . Hypertension Mother     Social History   Tobacco Use  . Smoking status: Current Every Day Smoker    Packs/day: 1.50  . Smokeless tobacco: Never Used  Vaping Use  . Vaping Use: Never used  Substance Use Topics  . Alcohol use: Yes    Comment: socially  . Drug use: Yes    Types: Marijuana    Home Medications Prior to Admission medications   Medication Sig Start Date End Date Taking? Authorizing Provider  famotidine (PEPCID) 20 MG tablet Take 1 tablet (20 mg total) by mouth 2 (two) times daily. 01/09/20  Yes Wallis BambergMani, Mario, PA-C  albuterol (VENTOLIN HFA) 108 (90 Base) MCG/ACT inhaler Inhale 2 puffs into the lungs every 4 (four) hours as needed for wheezing or shortness of breath. Patient not taking: Reported on 04/07/2021 03/25/20 04/24/20   Ronnie DossMcLean, Kelly A, PA-C  cephALEXin (KEFLEX) 500 MG capsule Take 1 capsule (500 mg total) by mouth 2 (two) times daily. Patient not taking: No sig reported 03/08/21   Roxy HorsemanBrowning, Robert, PA-C  meloxicam (MOBIC) 7.5 MG tablet Take 1 tablet (7.5 mg total) by mouth daily. Patient not taking: No sig reported 01/09/20   Wallis BambergMani, Mario, PA-C  methylPREDNISolone (MEDROL DOSEPAK) 4 MG TBPK tablet Use as directed on the package Patient not taking: No sig reported 11/20/20   Cardama, Amadeo GarnetPedro Eduardo, MD  omeprazole (PRILOSEC) 20 MG capsule Take 1 capsule (20 mg total) by mouth daily. Patient not taking: No sig reported 01/09/20   Wallis BambergMani, Mario, PA-C  ondansetron (ZOFRAN ODT) 4 MG disintegrating tablet Take 1 tablet (4 mg total) by mouth every 8 (eight) hours as needed for nausea or vomiting. Patient not taking: No sig reported 03/08/21   Roxy HorsemanBrowning, Robert, PA-C  oxyCODONE-acetaminophen (PERCOCET/ROXICET) 5-325 MG tablet Take 1-2 tablets by mouth every 4 (four) hours as needed for severe pain. May take 2 tablets PO q 6 hours for severe pain - Do not take with Tylenol as this tablet already contains tylenol Patient not taking: No sig reported 03/08/21   Roxy HorsemanBrowning, Robert, PA-C  promethazine (PHENERGAN) 25 MG suppository Place 1 suppository (25 mg total) rectally every 6 (six) hours as needed for nausea or vomiting. Patient not taking: No sig reported 03/09/21   Melene PlanFloyd, Dan, DO  promethazine (PHENERGAN) 25 MG tablet Take 1 tablet (25 mg total) by mouth every 6 (six) hours as needed for nausea or vomiting. Patient not taking: No sig reported 03/09/21   Melene Plan, DO    Allergies    Patient has no known allergies.  Review of Systems   Review of Systems  Constitutional: Positive for appetite change. Negative for activity change, chills, diaphoresis, fatigue and fever.  HENT: Negative.   Eyes: Negative.   Respiratory: Negative.   Cardiovascular: Negative.   Gastrointestinal: Positive for abdominal pain, diarrhea, nausea and  vomiting. Negative for blood in stool and constipation.  Genitourinary: Negative.   Musculoskeletal: Negative.   Skin: Negative.   Neurological: Negative.     Physical Exam Updated Vital Signs BP (!) 141/94   Pulse 68   Temp 98 F (36.7 C) (Oral)   Resp 18   LMP  (LMP Unknown) Comment: neg test  SpO2 99%   Physical Exam Vitals and nursing note reviewed.  Constitutional:      Appearance: She is ill-appearing. She is not toxic-appearing.  HENT:     Head: Normocephalic and atraumatic.     Nose: Nose normal.     Mouth/Throat:     Mouth: Mucous membranes are moist.     Pharynx: Oropharynx is clear. Uvula midline. No oropharyngeal exudate, posterior oropharyngeal erythema or uvula swelling.     Tonsils: No tonsillar exudate.  Eyes:     General: Lids are normal. Vision grossly intact.        Right eye: No discharge.        Left eye: No discharge.     Extraocular Movements: Extraocular movements intact.     Conjunctiva/sclera: Conjunctivae normal.     Pupils: Pupils are equal, round, and reactive to light.  Neck:     Trachea: Trachea and phonation normal.  Cardiovascular:     Rate and Rhythm: Normal rate and regular rhythm.     Pulses: Normal pulses.     Heart sounds: Normal heart sounds. No murmur heard.   Pulmonary:     Effort: Pulmonary effort is normal. No tachypnea, bradypnea, accessory muscle usage, prolonged expiration or respiratory distress.     Breath sounds: Normal breath sounds. No wheezing or rales.  Chest:     Chest wall: No mass, lacerations, deformity, swelling, tenderness, crepitus or edema.  Abdominal:     General: Bowel sounds are normal. There is no distension.     Palpations: Abdomen is soft.     Tenderness: There is generalized abdominal tenderness and tenderness in the right upper quadrant and epigastric area. There is no right CVA tenderness, left CVA tenderness, guarding or rebound. Positive signs include Murphy's sign.  Musculoskeletal:         General: No deformity.     Cervical back: Normal range of motion and neck supple. No edema, rigidity or crepitus. No pain with movement, spinous process tenderness or muscular tenderness.     Right lower leg: No edema.     Left lower leg: No edema.  Lymphadenopathy:     Cervical: No cervical adenopathy.  Skin:    General: Skin is warm and dry.  Neurological:     General: No focal deficit present.     Mental Status: She is alert and oriented to person, place, and time. Mental status is at baseline.     Sensory: Sensation is intact.     Motor: Motor function is intact.     Gait: Gait is intact.  Psychiatric:        Mood and Affect: Mood normal.    ED Results / Procedures / Treatments   Labs (all labs ordered are listed, but only abnormal results are displayed) Labs Reviewed  COMPREHENSIVE METABOLIC PANEL - Abnormal; Notable for the following components:      Result Value   Potassium 3.4 (*)    Glucose, Bld 377 (*)    Total Protein 8.3 (*)    All other components within normal limits  CBC WITH DIFFERENTIAL/PLATELET - Abnormal; Notable for the following components:   Hemoglobin 15.6 (*)    All other components within normal limits  URINALYSIS, ROUTINE W REFLEX MICROSCOPIC - Abnormal; Notable for the following components:   Specific Gravity, Urine 1.040 (*)    Glucose, UA >=500 (*)    Ketones, ur 80 (*)    Protein, ur 30 (*)    Leukocytes,Ua SMALL (*)    Bacteria, UA RARE (*)    All other components within normal limits  CBG MONITORING, ED - Abnormal; Notable for the following components:   Glucose-Capillary 326 (*)    All other components within normal limits  RESP PANEL BY RT-PCR (FLU A&B, COVID) ARPGX2  LIPASE, BLOOD  HIV ANTIBODY (ROUTINE TESTING W REFLEX)  MAGNESIUM  MAGNESIUM  CBC  COMPREHENSIVE METABOLIC PANEL  HEMOGLOBIN A1C  I-STAT BETA HCG BLOOD, ED ([facility], [facility], [facility] ONLY)    EKG EKG Interpretation  Date/Time:  Tuesday Apr 07 2021 20:06:03 EDT Ventricular Rate:   76 PR Interval:  148 QRS Duration: 76 QT Interval:  438 QTC Calculation: 493 R Axis:   78 Text Interpretation: Sinus rhythm Consider left ventricular hypertrophy Borderline prolonged QT interval Confirmed by Alona Bene [phone]) on 04/07/2021 8:23:13 PM   Radiology CT Abdomen Pelvis W Contrast  Result Date: 04/07/2021 CLINICAL DATA:  Left upper quadrant abdominal pain EXAM: CT ABDOMEN AND PELVIS WITH CONTRAST TECHNIQUE: Multidetector CT imaging of the abdomen and pelvis was performed using the standard protocol following bolus administration of intravenous contrast. CONTRAST:  OMNIPAQUE IOHEXOL 300 MG/ML SOLN, <See Chart> OMNIPAQUE IOHEXOL 300 MG/ML SOLN COMPARISON:  Abdominal ultrasound 04/07/2021 FINDINGS: The patient was apparently unable to raise the right arm during imaging, resulting in mild reduction of signal to noise ratio. Lower chest: Linear subsegmental atelectasis or scarring in the right middle lobe and posterior basal segment of the left lower lobe. Mild distal esophageal wall thickening, esophagitis would be the most common cause. Hepatobiliary: Multiple gallstones in the gallbladder likely with sludge in the gallbladder as well. No pericholecystic fluid identified. Focal steatosis in segment 4b of the liver adjacent to the falciform ligament. Pancreas: Unremarkable Spleen: Unremarkable Adrenals/Urinary Tract: Unremarkable Stomach/Bowel: Unremarkable.  Normal appendix.  No dilated bowel. Vascular/Lymphatic: Aortoiliac atherosclerotic vascular disease. No pathologic adenopathy. Reproductive: Unremarkable Other: No supplemental non-categorized findings. Musculoskeletal: Unremarkable IMPRESSION: 1. Cholelithiasis. 2. Wall thickening in the distal esophagus, esophagitis would be the most common cause. 3.  Aortic Atherosclerosis (ICD10-I70.0). 4. No specific left upper quadrant finding to fully explain the patient's abdominal pain. Electronically Signed   By: Gaylyn Rong M.D.   On:  04/07/2021 20:22   US Abdomen Limited RUQ (LIVER/GB)  Result Date: 04/07/2021 CLINICAL DATA:  Abdominal pain. EXAM: ULTRASOUND ABDOMEN LIMITED RIGHT UPPER QUADRANT COMPARISON:  Ultrasound, 03/08/2021. FINDINGS: Gallbladder: Gallbladder is filled with stones and sludge. No wall thickening or pericholecystic fluid. Positive sonographic Murphy's sign. Common bile duct: Diameter: 4 mm. Liver: No focal lesion identified. Within normal limits in parenchymal echogenicity.  Portal vein is patent on color Doppler imaging with normal direction of blood flow towards the liver. Other: None. IMPRESSION: 1. Gallbladder filled with sludge and stones with a positive sonographic Murphy sign, but no wall thickening or pericholecystic fluid. This appearance is stable from the prior study. No convincing acute cholecystitis. 2. Normal liver.  No bile duct dilation. Electronically Signed   By: Amie Portland M.D.   On: 04/07/2021 16:07    Procedures Procedures   Medications Ordered in ED Medications  iohexol (OMNIPAQUE) 300 MG/ML solution 100 mL ( Intravenous Canceled Entry 04/07/21 1945)  HYDROmorphone (DILAUDID) injection 0.5 mg (has no administration in time range)  naloxone (NARCAN) injection 0.4 mg (has no administration in time range)  ondansetron (ZOFRAN) injection 4 mg (has no administration in time range)  potassium chloride 10 mEq in 100 mL IVPB (has no administration in time range)  lactated ringers infusion (has no administration in time range)  insulin aspart (novoLOG) injection 0-9 Units (7 Units Subcutaneous Given 04/07/21 2217)  LORazepam (ATIVAN) injection 0.5 mg (has no administration in time range)  LORazepam (ATIVAN) injection 0.5 mg (has no administration in time range)  ondansetron (ZOFRAN-ODT) disintegrating tablet 4 mg (4 mg Oral Given 04/07/21 1600)  fentaNYL (SUBLIMAZE) injection 50 mcg (50 mcg Intravenous Given 04/07/21 1642)  fentaNYL (SUBLIMAZE) injection 50 mcg (50 mcg Intravenous Given 04/07/21  1835)  ondansetron (ZOFRAN) injection 4 mg (4 mg Intravenous Given 04/07/21 1833)  iohexol (OMNIPAQUE) 300 MG/ML solution 100 mL (100 mLs Intravenous Contrast Given 04/07/21 1930)  morphine 4 MG/ML injection 4 mg (4 mg Intravenous Given 04/07/21 2013)  ondansetron (ZOFRAN) injection 4 mg (4 mg Intravenous Given 04/07/21 2012)  sodium chloride 0.9 % bolus 1,000 mL (1,000 mLs Intravenous New Bag/Given 04/07/21 2149)    ED Course  I have reviewed the triage vital signs and the nursing notes.  Pertinent labs & imaging results that were available during my care of the patient were reviewed by me and considered in my medical decision making (see chart for details).  Clinical Course as of 04/07/21 2254  Tue Apr 07, 2021  2130 Consult to general surgeon, Dr. Drexel Iha, who recommends that is reasonable to admit this patient to the hospital for pain control.  Patient will be evaluated by general surgery with possible cholecystectomy tomorrow. [RS]  2150 Consult to hospitalist, Dr. Arlean Hopping, who is agreeable to admitting this patient to his service.  Appreciate his collaboration in the care of this patient. [RS]    Clinical Course User Index [RS] Abbegail Matuska, Idelia Salm   MDM Rules/Calculators/A&P                          45 year old female with history of right upper quadrant abdominal pain and cholelithiasis who presents today with worsening right upper quadrant pain and nausea/vomiting since this morning.  Differential diagnosis includes but is not limited to biliary colic, cholelithiasis, cholecystitis, cholangitis, appendicitis, mesenteric ischemia, choledocholithiasis.  Hypertensive on intake vitals otherwise normal.  Cardiopulmonary exam is normal, abdominal exam with right upper quadrant, epigastric, and periumbilical tenderness to palpation, positive Murphy sign.  CBC unremarkable, CMP with mild hypokalemia of 3.4, lipase is normal, patient is not pregnant.  UA pending, respiratory pathogen  panel pending.  Right upper quadrant ultrasound with positive sonographic Murphy sign but without wall thickening or pericholecystic fluid, stable from prior study, no acute cholecystitis.  Patient has been seen by [facility] [place] [facility], most recently today.  Will  consult their service for recommendations as patient remains uncomfortable and nauseous despite multiple doses of medications in the ED.  Consult to general surgery as above who recommends medical admission for possible cholecystectomy tomorrow.  Consult to hospitalist, who is agreeable to admit this patient to service.  No further work-up warranted in the ED at this time.  Shunda voiced understanding of her medical evaluation and treatment plan.  Each of her questions was answered to her expressed satisfaction.  She is amenable to plan for admission at this time.    This chart was dictated using voice recognition software, Dragon. Despite the best efforts of this provider to proofread and correct errors, errors may still occur which can change documentation meaning.   Final Clinical Impression(s) / ED Diagnoses Final diagnoses:  Abdominal pain    Rx / DC Orders ED Discharge Orders    None       Sherrilee Gilles 04/07/21 2254    Charlynne Pander, MD 04/07/21 2312

## 2021-04-08 ENCOUNTER — Other Ambulatory Visit: Payer: Self-pay

## 2021-04-08 ENCOUNTER — Inpatient Hospital Stay (HOSPITAL_COMMUNITY): Payer: Medicaid Other | Admitting: Anesthesiology

## 2021-04-08 ENCOUNTER — Encounter (HOSPITAL_COMMUNITY)
Admission: EM | Disposition: A | Discharge status: Home / Self Care | Payer: Self-pay | Source: Home / Self Care | Attending: Internal Medicine

## 2021-04-08 ENCOUNTER — Encounter (HOSPITAL_COMMUNITY): Payer: Self-pay | Admitting: Internal Medicine

## 2021-04-08 DIAGNOSIS — K219 Gastro-esophageal reflux disease without esophagitis: Secondary | ICD-10-CM | POA: Diagnosis present

## 2021-04-08 DIAGNOSIS — R739 Hyperglycemia, unspecified: Secondary | ICD-10-CM | POA: Diagnosis present

## 2021-04-08 DIAGNOSIS — K819 Cholecystitis, unspecified: Secondary | ICD-10-CM

## 2021-04-08 DIAGNOSIS — K8020 Calculus of gallbladder without cholecystitis without obstruction: Secondary | ICD-10-CM | POA: Diagnosis not present

## 2021-04-08 DIAGNOSIS — R109 Unspecified abdominal pain: Secondary | ICD-10-CM | POA: Diagnosis present

## 2021-04-08 DIAGNOSIS — Z720 Tobacco use: Secondary | ICD-10-CM | POA: Diagnosis present

## 2021-04-08 DIAGNOSIS — E860 Dehydration: Secondary | ICD-10-CM | POA: Diagnosis present

## 2021-04-08 DIAGNOSIS — R112 Nausea with vomiting, unspecified: Secondary | ICD-10-CM | POA: Diagnosis present

## 2021-04-08 DIAGNOSIS — E876 Hypokalemia: Secondary | ICD-10-CM | POA: Diagnosis present

## 2021-04-08 HISTORY — PX: CHOLECYSTECTOMY: SHX55

## 2021-04-08 LAB — COMPREHENSIVE METABOLIC PANEL
ALT: 34 U/L (ref 0–44)
AST: 27 U/L (ref 15–41)
Albumin: 4.2 g/dL (ref 3.5–5.0)
Alkaline Phosphatase: 79 U/L (ref 38–126)
Anion gap: 14 (ref 5–15)
BUN: 10 mg/dL (ref 6–20)
CO2: 21 mmol/L — ABNORMAL LOW (ref 22–32)
Calcium: 9.8 mg/dL (ref 8.9–10.3)
Chloride: 107 mmol/L (ref 98–111)
Creatinine, Ser: 0.5 mg/dL (ref 0.44–1.00)
GFR, Estimated: >60 mL/min (ref >60)
Glucose, Bld: 231 mg/dL — ABNORMAL HIGH (ref 70–99)
Potassium: 4.6 mmol/L (ref 3.5–5.1)
Sodium: 142 mmol/L (ref 135–145)
Total Bilirubin: 1 mg/dL (ref 0.3–1.2)
Total Protein: 7.8 g/dL (ref 6.5–8.1)

## 2021-04-08 LAB — CBC WITH DIFFERENTIAL/PLATELET
Abs Immature Granulocytes: 0.08 10*3/uL — ABNORMAL HIGH (ref 0.00–0.07)
Basophils Absolute: 0 10*3/uL (ref 0.0–0.1)
Basophils Relative: 0 %
Eosinophils Absolute: 0 10*3/uL (ref 0.0–0.5)
Eosinophils Relative: 0 %
HCT: 42.6 % (ref 36.0–46.0)
Hemoglobin: 14.6 g/dL (ref 12.0–15.0)
Immature Granulocytes: 1 %
Lymphocytes Relative: 18 %
Lymphs Abs: 2.7 10*3/uL (ref 0.7–4.0)
MCH: 30.9 pg (ref 26.0–34.0)
MCHC: 34.3 g/dL (ref 30.0–36.0)
MCV: 90.1 fL (ref 80.0–100.0)
Monocytes Absolute: 0.6 10*3/uL (ref 0.1–1.0)
Monocytes Relative: 4 %
Neutro Abs: 11.3 10*3/uL — ABNORMAL HIGH (ref 1.7–7.7)
Neutrophils Relative %: 77 %
Platelets: 272 10*3/uL (ref 150–400)
RBC: 4.73 MIL/uL (ref 3.87–5.11)
RDW: 11.6 % (ref 11.5–15.5)
WBC: 14.7 10*3/uL — ABNORMAL HIGH (ref 4.0–10.5)
nRBC: 0 % (ref 0.0–0.2)

## 2021-04-08 LAB — HEMOGLOBIN A1C
Hgb A1c MFr Bld: 11.1 % — ABNORMAL HIGH (ref 4.8–5.6)
Mean Plasma Glucose: 271.87 mg/dL

## 2021-04-08 LAB — PROTIME-INR
INR: 1 (ref 0.8–1.2)
Prothrombin Time: 13.3 seconds (ref 11.4–15.2)

## 2021-04-08 LAB — SURGICAL PCR SCREEN
MRSA, PCR: NEGATIVE
Staphylococcus aureus: POSITIVE — AB

## 2021-04-08 LAB — GLUCOSE, CAPILLARY
Glucose-Capillary: 222 mg/dL — ABNORMAL HIGH (ref 70–99)
Glucose-Capillary: 247 mg/dL — ABNORMAL HIGH (ref 70–99)
Glucose-Capillary: 278 mg/dL — ABNORMAL HIGH (ref 70–99)

## 2021-04-08 LAB — LIPASE, BLOOD: Lipase: 26 U/L (ref 11–51)

## 2021-04-08 LAB — HIV ANTIBODY (ROUTINE TESTING W REFLEX): HIV Screen 4th Generation wRfx: NONREACTIVE

## 2021-04-08 LAB — MAGNESIUM: Magnesium: 2 mg/dL (ref 1.7–2.4)

## 2021-04-08 SURGERY — LAPAROSCOPIC CHOLECYSTECTOMY WITH INTRAOPERATIVE CHOLANGIOGRAM
Anesthesia: General

## 2021-04-08 MED ORDER — HYDROMORPHONE HCL 1 MG/ML IJ SOLN: 1.0000 mg | INTRAMUSCULAR | Status: DC | PRN

## 2021-04-08 MED ORDER — SUCCINYLCHOLINE CHLORIDE 200 MG/10ML IV SOSY
PREFILLED_SYRINGE | INTRAVENOUS | Status: AC
  Filled 2021-04-08: qty 10

## 2021-04-08 MED ORDER — CHLORHEXIDINE GLUCONATE CLOTH 2 % EX PADS
6.0000 | MEDICATED_PAD | Freq: Once | CUTANEOUS | Status: AC
  Administered 2021-04-08: 6 via TOPICAL

## 2021-04-08 MED ORDER — ONDANSETRON HCL 4 MG/2ML IJ SOLN
INTRAMUSCULAR | Status: AC
  Filled 2021-04-08: qty 2

## 2021-04-08 MED ORDER — SCOPOLAMINE 1 MG/3DAYS TD PT72: 1.0000 | MEDICATED_PATCH | TRANSDERMAL | Status: DC

## 2021-04-08 MED ORDER — OXYCODONE HCL 5 MG PO TABS: 5.0000 mg | ORAL_TABLET | Freq: Once | ORAL | Status: DC | PRN

## 2021-04-08 MED ORDER — FENTANYL CITRATE (PF) 250 MCG/5ML IJ SOLN
INTRAMUSCULAR | Status: DC | PRN
  Administered 2021-04-08: 100 ug via INTRAVENOUS
  Administered 2021-04-08: 50 ug via INTRAVENOUS
  Administered 2021-04-08: 100 ug via INTRAVENOUS

## 2021-04-08 MED ORDER — MIDAZOLAM HCL 5 MG/5ML IJ SOLN
INTRAMUSCULAR | Status: DC | PRN
  Administered 2021-04-08: 2 mg via INTRAVENOUS

## 2021-04-08 MED ORDER — ONDANSETRON HCL 4 MG/2ML IJ SOLN
INTRAMUSCULAR | Status: DC | PRN
  Administered 2021-04-08: 4 mg via INTRAVENOUS

## 2021-04-08 MED ORDER — SUGAMMADEX SODIUM 200 MG/2ML IV SOLN
INTRAVENOUS | Status: DC | PRN
  Administered 2021-04-08: 200 mg via INTRAVENOUS

## 2021-04-08 MED ORDER — ACETAMINOPHEN 10 MG/ML IV SOLN
INTRAVENOUS | Status: AC
  Filled 2021-04-08: qty 100

## 2021-04-08 MED ORDER — DEXAMETHASONE SODIUM PHOSPHATE 10 MG/ML IJ SOLN
INTRAMUSCULAR | Status: AC
  Filled 2021-04-08: qty 1

## 2021-04-08 MED ORDER — OXYCODONE HCL 5 MG PO TABS
5.0000 mg | ORAL_TABLET | ORAL | Status: DC | PRN
  Administered 2021-04-08: 5 mg via ORAL
  Administered 2021-04-08 – 2021-04-09 (×2): 10 mg via ORAL
  Filled 2021-04-08 (×3): qty 2

## 2021-04-08 MED ORDER — LACTATED RINGERS IR SOLN
Status: DC | PRN
  Administered 2021-04-08: 1000 mL

## 2021-04-08 MED ORDER — PHENYLEPHRINE 40 MCG/ML (10ML) SYRINGE FOR IV PUSH (FOR BLOOD PRESSURE SUPPORT)
PREFILLED_SYRINGE | INTRAVENOUS | Status: DC | PRN
  Administered 2021-04-08: 40 ug via INTRAVENOUS

## 2021-04-08 MED ORDER — ROCURONIUM BROMIDE 10 MG/ML (PF) SYRINGE
PREFILLED_SYRINGE | INTRAVENOUS | Status: AC
  Filled 2021-04-08: qty 10

## 2021-04-08 MED ORDER — PROMETHAZINE HCL 25 MG/ML IJ SOLN: 6.2500 mg | INTRAMUSCULAR | Status: DC | PRN

## 2021-04-08 MED ORDER — KETOROLAC TROMETHAMINE 30 MG/ML IJ SOLN: 30.0000 mg | Freq: Once | INTRAMUSCULAR | Status: DC

## 2021-04-08 MED ORDER — BUPIVACAINE HCL (PF) 0.5 % IJ SOLN
INTRAMUSCULAR | Status: DC | PRN
  Administered 2021-04-08: 30 mL

## 2021-04-08 MED ORDER — METRONIDAZOLE 500 MG PO TABS
500.0000 mg | ORAL_TABLET | Freq: Two times a day (BID) | ORAL | Status: DC
  Administered 2021-04-08 – 2021-04-10 (×5): 500 mg via ORAL
  Filled 2021-04-08 (×5): qty 1

## 2021-04-08 MED ORDER — OXYCODONE HCL 5 MG/5ML PO SOLN
5.0000 mg | Freq: Once | ORAL | Status: DC | PRN
Start: 2021-04-08 — End: 2021-04-08

## 2021-04-08 MED ORDER — PROPOFOL 10 MG/ML IV BOLUS
INTRAVENOUS | Status: AC
  Filled 2021-04-08: qty 40

## 2021-04-08 MED ORDER — KETOROLAC TROMETHAMINE 30 MG/ML IJ SOLN
INTRAMUSCULAR | Status: AC
  Filled 2021-04-08: qty 1

## 2021-04-08 MED ORDER — CHLORHEXIDINE GLUCONATE CLOTH 2 % EX PADS: 6.0000 | MEDICATED_PAD | Freq: Every day | CUTANEOUS | Status: DC

## 2021-04-08 MED ORDER — DEXMEDETOMIDINE (PRECEDEX) IN NS 20 MCG/5ML (4 MCG/ML) IV SYRINGE
PREFILLED_SYRINGE | INTRAVENOUS | Status: DC | PRN
  Administered 2021-04-08 (×3): 8 ug via INTRAVENOUS
  Administered 2021-04-08: 4 ug via INTRAVENOUS

## 2021-04-08 MED ORDER — ORAL CARE MOUTH RINSE
15.0000 mL | Freq: Once | OROMUCOSAL | Status: AC
Start: 2021-04-08 — End: 2021-04-08

## 2021-04-08 MED ORDER — FENTANYL CITRATE (PF) 250 MCG/5ML IJ SOLN
INTRAMUSCULAR | Status: AC
  Filled 2021-04-08: qty 5

## 2021-04-08 MED ORDER — LIDOCAINE 2% (20 MG/ML) 5 ML SYRINGE
INTRAMUSCULAR | Status: DC | PRN
  Administered 2021-04-08: 80 mg via INTRAVENOUS

## 2021-04-08 MED ORDER — DEXAMETHASONE SODIUM PHOSPHATE 10 MG/ML IJ SOLN
INTRAMUSCULAR | Status: DC | PRN
  Administered 2021-04-08: 10 mg via INTRAVENOUS

## 2021-04-08 MED ORDER — ROCURONIUM BROMIDE 10 MG/ML (PF) SYRINGE
PREFILLED_SYRINGE | INTRAVENOUS | Status: DC | PRN
  Administered 2021-04-08: 20 mg via INTRAVENOUS
  Administered 2021-04-08: 10 mg via INTRAVENOUS

## 2021-04-08 MED ORDER — AMISULPRIDE (ANTIEMETIC) 5 MG/2ML IV SOLN: 10.0000 mg | Freq: Once | INTRAVENOUS | Status: DC | PRN

## 2021-04-08 MED ORDER — LIVING WELL WITH DIABETES BOOK
Freq: Once | Status: AC
  Filled 2021-04-08: qty 1

## 2021-04-08 MED ORDER — ENSURE PRE-SURGERY PO LIQD: 296.0000 mL | Freq: Once | ORAL | Status: DC

## 2021-04-08 MED ORDER — DEXMEDETOMIDINE (PRECEDEX) IN NS 20 MCG/5ML (4 MCG/ML) IV SYRINGE
PREFILLED_SYRINGE | INTRAVENOUS | Status: AC
  Filled 2021-04-08: qty 5

## 2021-04-08 MED ORDER — SUCCINYLCHOLINE CHLORIDE 200 MG/10ML IV SOSY
PREFILLED_SYRINGE | INTRAVENOUS | Status: DC | PRN
  Administered 2021-04-08: 100 mg via INTRAVENOUS

## 2021-04-08 MED ORDER — MIDAZOLAM HCL 2 MG/2ML IJ SOLN
INTRAMUSCULAR | Status: AC
  Filled 2021-04-08: qty 2

## 2021-04-08 MED ORDER — CHLORHEXIDINE GLUCONATE 0.12 % MT SOLN
15.0000 mL | Freq: Once | OROMUCOSAL | Status: AC
  Administered 2021-04-08: 15 mL via OROMUCOSAL

## 2021-04-08 MED ORDER — PANTOPRAZOLE SODIUM 40 MG IV SOLR
40.0000 mg | INTRAVENOUS | Status: DC
  Administered 2021-04-08 – 2021-04-10 (×3): 40 mg via INTRAVENOUS
  Filled 2021-04-08 (×3): qty 40

## 2021-04-08 MED ORDER — LACTATED RINGERS IV SOLN: INTRAVENOUS | Status: DC

## 2021-04-08 MED ORDER — BUPIVACAINE HCL (PF) 0.5 % IJ SOLN
INTRAMUSCULAR | Status: AC
  Filled 2021-04-08: qty 30

## 2021-04-08 MED ORDER — FENTANYL CITRATE (PF) 100 MCG/2ML IJ SOLN
INTRAMUSCULAR | Status: AC
  Filled 2021-04-08: qty 2

## 2021-04-08 MED ORDER — FENTANYL CITRATE (PF) 100 MCG/2ML IJ SOLN
25.0000 ug | INTRAMUSCULAR | Status: DC | PRN
  Administered 2021-04-08: 50 ug via INTRAVENOUS

## 2021-04-08 MED ORDER — ACETAMINOPHEN 10 MG/ML IV SOLN: 1000.0000 mg | Freq: Once | INTRAVENOUS | Status: DC | PRN

## 2021-04-08 MED ORDER — 0.9 % SODIUM CHLORIDE (POUR BTL) OPTIME
TOPICAL | Status: DC | PRN
  Administered 2021-04-08: 1000 mL

## 2021-04-08 MED ORDER — SODIUM CHLORIDE 0.9 % IV SOLN
2.0000 g | Freq: Every day | INTRAVENOUS | Status: DC
  Administered 2021-04-08 – 2021-04-10 (×3): 2 g via INTRAVENOUS
  Filled 2021-04-08 (×3): qty 2

## 2021-04-08 MED ORDER — PHENYLEPHRINE 40 MCG/ML (10ML) SYRINGE FOR IV PUSH (FOR BLOOD PRESSURE SUPPORT)
PREFILLED_SYRINGE | INTRAVENOUS | Status: AC
  Filled 2021-04-08: qty 10

## 2021-04-08 MED ORDER — ACETAMINOPHEN 10 MG/ML IV SOLN
INTRAVENOUS | Status: DC | PRN
  Administered 2021-04-08: 1000 mg via INTRAVENOUS

## 2021-04-08 MED ORDER — PROPOFOL 10 MG/ML IV BOLUS
INTRAVENOUS | Status: DC | PRN
  Administered 2021-04-08: 200 mg via INTRAVENOUS

## 2021-04-08 MED ORDER — LIDOCAINE 2% (20 MG/ML) 5 ML SYRINGE
INTRAMUSCULAR | Status: AC
  Filled 2021-04-08: qty 5

## 2021-04-08 MED ORDER — MUPIROCIN 2 % EX OINT
1.0000 "application " | TOPICAL_OINTMENT | Freq: Two times a day (BID) | CUTANEOUS | Status: DC
  Administered 2021-04-08 – 2021-04-10 (×5): 1 via NASAL
  Filled 2021-04-08: qty 22

## 2021-04-08 MED ORDER — SCOPOLAMINE 1 MG/3DAYS TD PT72
MEDICATED_PATCH | TRANSDERMAL | Status: AC
  Administered 2021-04-08: 1.5 mg via TRANSDERMAL
  Filled 2021-04-08: qty 1

## 2021-04-08 SURGICAL SUPPLY — 30 items
APPLIER CLIP 5 13 M/L LIGAMAX5 (MISCELLANEOUS) ×2
CABLE HIGH FREQUENCY MONO STRZ (ELECTRODE) ×2 IMPLANT
CHLORAPREP W/TINT 26 (MISCELLANEOUS) ×2 IMPLANT
CLIP APPLIE 5 13 M/L LIGAMAX5 (MISCELLANEOUS) ×1 IMPLANT
COVER MAYO STAND STRL (DRAPES) IMPLANT
COVER WAND RF STERILE (DRAPES) IMPLANT
DECANTER SPIKE VIAL GLASS SM (MISCELLANEOUS) ×2 IMPLANT
DERMABOND ADVANCED (GAUZE/BANDAGES/DRESSINGS) ×1
DERMABOND ADVANCED .7 DNX12 (GAUZE/BANDAGES/DRESSINGS) ×1 IMPLANT
DRAPE C-ARM 42X120 X-RAY (DRAPES) IMPLANT
ELECT REM PT RETURN 15FT ADLT (MISCELLANEOUS) ×2 IMPLANT
GLOVE SURG ENC MOIS LTX SZ7.5 (GLOVE) ×2 IMPLANT
GOWN STRL REUS W/TWL XL LVL3 (GOWN DISPOSABLE) ×4 IMPLANT
HEMOSTAT SURGICEL 4X8 (HEMOSTASIS) IMPLANT
KIT BASIN OR (CUSTOM PROCEDURE TRAY) ×2 IMPLANT
KIT TURNOVER KIT A (KITS) ×2 IMPLANT
PENCIL SMOKE EVACUATOR (MISCELLANEOUS) IMPLANT
POUCH SPECIMEN RETRIEVAL 10MM (ENDOMECHANICALS) ×2 IMPLANT
SCISSORS LAP 5X35 DISP (ENDOMECHANICALS) ×2 IMPLANT
SET CHOLANGIOGRAPH MIX (MISCELLANEOUS) IMPLANT
SET IRRIG TUBING LAPAROSCOPIC (IRRIGATION / IRRIGATOR) ×2 IMPLANT
SET TUBE SMOKE EVAC HIGH FLOW (TUBING) ×2 IMPLANT
SLEEVE XCEL OPT CAN 5 100 (ENDOMECHANICALS) ×4 IMPLANT
SUT MNCRL AB 4-0 PS2 18 (SUTURE) ×2 IMPLANT
SUT VICRYL 0 UR6 27IN ABS (SUTURE) ×2 IMPLANT
TOWEL OR 17X26 10 PK STRL BLUE (TOWEL DISPOSABLE) ×2 IMPLANT
TOWEL OR NON WOVEN STRL DISP B (DISPOSABLE) ×2 IMPLANT
TRAY LAPAROSCOPIC (CUSTOM PROCEDURE TRAY) ×2 IMPLANT
TROCAR BLADELESS OPT 5 100 (ENDOMECHANICALS) ×2 IMPLANT
TROCAR XCEL BLUNT TIP 100MML (ENDOMECHANICALS) ×2 IMPLANT

## 2021-04-08 NOTE — Progress Notes (Signed)
Initial Nutrition Assessment  INTERVENTION:   Once diet advanced: -Ensure Surgery po BID, each supplement provides 330 kcal and 18 grams of protein  NUTRITION DIAGNOSIS:   Increased nutrient needs related to post-op healing as evidenced by estimated needs.  GOAL:   Patient will meet greater than or equal to 90% of their needs  MONITOR:   Diet advancement,Labs,Weight trends,I & O's  REASON FOR ASSESSMENT:   Malnutrition Screening Tool    ASSESSMENT:   45 y.o. female with a history of cholelithiasis who presented with epigastric abdominal pain.  Patient with several recent episodes of epigastric and right upper quadrant abdominal pain that occurred after eating with associated nausea and vomiting for which she has presented several times to the emergency department for and found to have cholelithiasis.  Patient has been having N/V and diarrhea PTA. Currently in OR for lap cholecystectomy. NPO for procedure.  Per chart review, pt was followed by GI and had surgery scheduled. Pt last ate a burger which caused abdominal pain.  Recommend Ensure supplements once diet advanced following procedure.  Per weight records, last recorded weight is 159 lbs on 4/3.   Medications: Lactated ringers, KCl  Labs reviewed: CBGs: 222-247  NUTRITION - FOCUSED PHYSICAL EXAM:  In OR  Diet Order:   Diet Order            Diet NPO time specified Except for: Sips with Meds  Diet effective now                 EDUCATION NEEDS:   No education needs have been identified at this time  Skin:  Skin Assessment: Reviewed RN Assessment  Last BM:  PTA  Height:   Ht Readings from Last 1 Encounters:  03/08/21 5\' 9"  (1.753 m)    Weight:   Wt Readings from Last 1 Encounters:  03/08/21 72.6 kg   BMI: 23 kg/m^2  Estimated Nutritional Needs:   Kcal:  1650-1850  Protein:  75-90g  Fluid:  1.8L/day  05/08/21, MS, RD, LDN Inpatient Clinical Dietitian Contact information  available via Amion

## 2021-04-08 NOTE — Anesthesia Procedure Notes (Signed)
Procedure Name: Intubation Date/Time: 04/08/2021 12:20 PM Performed by: Lavina Hamman, CRNA Pre-anesthesia Checklist: Patient identified, Emergency Drugs available, Suction available, Patient being monitored and Timeout performed Patient Re-evaluated:Patient Re-evaluated prior to induction Oxygen Delivery Method: Circle system utilized Preoxygenation: Pre-oxygenation with 100% oxygen Induction Type: IV induction, Rapid sequence and Cricoid Pressure applied Laryngoscope Size: Mac and 3 Grade View: Grade II Tube type: Oral Tube size: 7.5 mm Number of attempts: 1 Airway Equipment and Method: Stylet Placement Confirmation: ETT inserted through vocal cords under direct vision,  positive ETCO2,  CO2 detector and breath sounds checked- equal and bilateral Secured at: 22 cm Tube secured with: Tape Dental Injury: Teeth and Oropharynx as per pre-operative assessment  Comments: ATOI

## 2021-04-08 NOTE — Anesthesia Preprocedure Evaluation (Addendum)
Anesthesia Evaluation  Patient identified by MRN, date of birth, ID band Patient awake    Reviewed: Allergy & Precautions, NPO status , Patient's Chart, lab work & pertinent test results  Airway Mallampati: II  TM Distance: >3 FB Neck ROM: Full    Dental no notable dental hx.    Pulmonary Current Smoker and Patient abstained from smoking.,    Pulmonary exam normal breath sounds clear to auscultation       Cardiovascular negative cardio ROS Normal cardiovascular exam Rhythm:Regular Rate:Normal  ECG: SB, rate 44   Neuro/Psych negative neurological ROS  negative psych ROS   GI/Hepatic Neg liver ROS, N/V   Endo/Other  negative endocrine ROS  Renal/GU negative Renal ROS     Musculoskeletal negative musculoskeletal ROS (+)   Abdominal   Peds  Hematology negative hematology ROS (+)   Anesthesia Other Findings Cholelithiasis  Reproductive/Obstetrics                            Anesthesia Physical Anesthesia Plan  ASA: II  Anesthesia Plan: General   Post-op Pain Management:    Induction: Intravenous  PONV Risk Score and Plan: 3 and Ondansetron, Dexamethasone, Midazolam, Scopolamine patch - Pre-op and Treatment may vary due to age or medical condition  Airway Management Planned: Oral ETT  Additional Equipment:   Intra-op Plan:   Post-operative Plan: Extubation in OR  Informed Consent: I have reviewed the patients History and Physical, chart, labs and discussed the procedure including the risks, benefits and alternatives for the proposed anesthesia with the patient or authorized representative who has indicated his/her understanding and acceptance.     Dental advisory given  Plan Discussed with: CRNA  Anesthesia Plan Comments:        Anesthesia Quick Evaluation

## 2021-04-08 NOTE — Anesthesia Postprocedure Evaluation (Signed)
Anesthesia Post Note  Patient: [name]  Procedure(s) Performed: LAPAROSCOPIC CHOLECYSTECTOMy (N/A )     Patient location during evaluation: PACU Anesthesia Type: General Level of consciousness: awake and alert Pain management: pain level controlled Vital Signs Assessment: post-procedure vital signs reviewed and stable Respiratory status: spontaneous breathing, nonlabored ventilation, respiratory function stable and patient connected to nasal cannula oxygen Cardiovascular status: blood pressure returned to baseline and stable Postop Assessment: no apparent nausea or vomiting Anesthetic complications: no   No complications documented.  Last Vitals:  Vitals:   04/08/21 1315 04/08/21 1345  BP: 136/82 138/79  Pulse: 86 77  Resp: 19 16  Temp:    SpO2: 100% 99%    Last Pain:  Vitals:   04/08/21 0800  TempSrc:   PainSc: 3                  Shelton Silvas

## 2021-04-08 NOTE — Consult Note (Addendum)
[name] [birth year]  [id number].    Requesting MD: Dr. Natale Milch Chief Complaint/Reason for Consult: Symptomatic cholelithiasis  HPI: [name] is a 45 y.o. female with a history of cholelithiasis who presented with epigastric abdominal pain.  Patient with several recent episodes of epigastric and right upper quadrant abdominal pain that occurred after eating with associated nausea and vomiting for which she has presented several times to the emergency department for and found to have cholelithiasis.  She was seen in our office by Dr. Donell Beers on 5/3 and recommended for laparoscopic cholecystectomy.  She reports after the office visit she went and had a burger and shortly after began having similar epigastric abdominal pain that radiated to her right upper quadrant with associated chills, nausea and vomiting.  Her pain was a 6-8/10 and constant.  She denies any associated fever, chest pain, shortness of breath, cough, reflux, or urinary symptoms.  In the ED patient underwent right upper quadrant ultrasound that showed gallstones filled with sludge and stones with positive Murphy sign but no wall thickening or pericholecystic fluid.  CBD 4 mm.  CT A/P showed cholelithiasis.  This also showed wall thickening of the distal esophagus with possible esophagitis.  WBC 14.7. LFT's wnl. Lipase 26. Patient was admitted to hospitalist service.  We are asked to see to consider laparoscopic cholecystectomy.  Patient's pain has improved to a 4/10 since admission.  She states she still has some nausea.  Her last episode of emesis was immediately after being brought up to the floor. Patient has a hx of prior c-section x 2. She is not on any blood thinners.   To note patient also had trichomonas noted in her urine.  She denies any vaginal discharge.  She reports her last sexual activity was in December.   ROS: Review of Systems  Constitutional: Positive for chills. Negative for fever.  Respiratory:  Negative for cough and shortness of breath.   Cardiovascular: Negative for chest pain and leg swelling.  Gastrointestinal: Positive for abdominal pain, diarrhea, nausea and vomiting. Negative for constipation.  Genitourinary: Negative for dysuria and hematuria.  Musculoskeletal: Negative for back pain.  Psychiatric/Behavioral: Positive for substance abuse.  All other systems reviewed and are negative.   Family History  Problem Relation Age of Onset  . Hypertension Mother     Past Medical History:  Diagnosis Date  . Cholelithiasis   . GERD (gastroesophageal reflux disease)   . Tobacco abuse     Past Surgical History:  Procedure Laterality Date  . CESAREAN SECTION      Social History:  reports that she has been smoking. She has been smoking about 1.50 packs per day. She has never used smokeless tobacco. She reports current alcohol use. She reports current drug use. Drug: Marijuana. Current tobacco use Current marijuana use.  No other illicit drug use Occasional alcohol use Is not currently employed  Allergies: No Known Allergies  Medications Prior to Admission  Medication Sig Dispense Refill  . famotidine (PEPCID) 20 MG tablet Take 1 tablet (20 mg total) by mouth 2 (two) times daily. 60 tablet 0  . albuterol (VENTOLIN HFA) 108 (90 Base) MCG/ACT inhaler Inhale 2 puffs into the lungs every 4 (four) hours as needed for wheezing or shortness of breath. (Patient not taking: Reported on 04/07/2021) 8 g 0  . cephALEXin (KEFLEX) 500 MG capsule Take 1 capsule (500 mg total) by mouth 2 (two) times daily. (Patient not taking: No sig reported) 14 capsule 0  .  meloxicam (MOBIC) 7.5 MG tablet Take 1 tablet (7.5 mg total) by mouth daily. (Patient not taking: No sig reported) 30 tablet 0  . methylPREDNISolone (MEDROL DOSEPAK) 4 MG TBPK tablet Use as directed on the package (Patient not taking: No sig reported) 21 tablet 0  . omeprazole (PRILOSEC) 20 MG capsule Take 1 capsule (20 mg total) by  mouth daily. (Patient not taking: No sig reported) 90 capsule 0  . ondansetron (ZOFRAN ODT) 4 MG disintegrating tablet Take 1 tablet (4 mg total) by mouth every 8 (eight) hours as needed for nausea or vomiting. (Patient not taking: No sig reported) 10 tablet 0  . oxyCODONE-acetaminophen (PERCOCET/ROXICET) 5-325 MG tablet Take 1-2 tablets by mouth every 4 (four) hours as needed for severe pain. May take 2 tablets PO q 6 hours for severe pain - Do not take with Tylenol as this tablet already contains tylenol (Patient not taking: No sig reported) 10 tablet 0  . promethazine (PHENERGAN) 25 MG suppository Place 1 suppository (25 mg total) rectally every 6 (six) hours as needed for nausea or vomiting. (Patient not taking: No sig reported) 12 each 0  . promethazine (PHENERGAN) 25 MG tablet Take 1 tablet (25 mg total) by mouth every 6 (six) hours as needed for nausea or vomiting. (Patient not taking: No sig reported) 30 tablet 0     Physical Exam: Blood pressure (!) 148/75, pulse 82, temperature 98.5 F (36.9 C), resp. rate 15, SpO2 100 %. General: pleasant, WD/WN white female who is laying in bed in NAD HEENT: head is normocephalic, atraumatic.  Sclera are noninjected.  PERRL.  Ears and nose without any masses or lesions.  Mouth is pink and moist. Dentition fair Heart: regular, rate, and rhythm.  Normal s1,s2. No obvious murmurs, gallops, or rubs noted.  Palpable pedal pulses bilaterally  Lungs: CTAB, no wheezes, rhonchi, or rales noted.  Respiratory effort nonlabored Abd: Soft, ND, RUQ and epigastric abdominal pain without peritonitis, +BS, no masses, hernias, or organomegaly MS: no BUE/BLE edema, calves soft and nontender Skin: warm and dry with no masses, lesions, or rashes Psych: A&Ox4 with an appropriate affect Neuro: cranial nerves grossly intact, equal strength in BUE/BLE bilaterally, moves all extremities,  normal speech, thought process intact, gait not assessed.  Results for orders placed or  performed during the hospital encounter of 04/07/21 (from the past 48 hour(s))  Comprehensive metabolic panel     Status: Abnormal   Collection Time: 04/07/21  3:01 PM  Result Value Ref Range   Sodium 136 135 - 145 mmol/L   Potassium 3.4 (L) 3.5 - 5.1 mmol/L   Chloride 100 98 - 111 mmol/L   CO2 22 22 - 32 mmol/L   Glucose, Bld 377 (H) 70 - 99 mg/dL    Comment: Glucose reference range applies only to samples taken after fasting for at least 8 hours.   BUN 10 6 - 20 mg/dL   Creatinine, Ser 1.61 0.44 - 1.00 mg/dL   Calcium 9.5 8.9 - 09.6 mg/dL   Total Protein 8.3 (H) 6.5 - 8.1 g/dL   Albumin 4.1 3.5 - 5.0 g/dL   AST 33 15 - 41 U/L   ALT 36 0 - 44 U/L   Alkaline Phosphatase 80 38 - 126 U/L   Total Bilirubin 0.9 0.3 - 1.2 mg/dL   GFR, Estimated >04 >54 mL/min    Comment: (NOTE) Calculated using the CKD-EPI Creatinine Equation (2021)    Anion gap 14 5 - 15    Comment:  Performed at [facility], [address] [place], [place] [address]  Lipase, blood     Status: None   Collection Time: 04/07/21  3:01 PM  Result Value Ref Range   Lipase 32 11 - 51 U/L    Comment: Performed at [facility], [address], [place], [place] [address]  CBC with Differential     Status: Abnormal   Collection Time: 04/07/21  3:01 PM  Result Value Ref Range   WBC 8.8 4.0 - 10.5 K/uL   RBC 4.98 3.87 - 5.11 MIL/uL   Hemoglobin 15.6 (H) 12.0 - 15.0 g/dL   HCT 09.8 11.9 - 14.7 %   MCV 91.0 80.0 - 100.0 fL   MCH 31.3 26.0 - 34.0 pg   MCHC 34.4 30.0 - 36.0 g/dL   RDW 82.9 56.2 - 13.0 %   Platelets 261 150 - 400 K/uL   nRBC 0.0 0.0 - 0.2 %   Neutrophils Relative % 60 %   Neutro Abs 5.4 1.7 - 7.7 K/uL   Lymphocytes Relative 33 %   Lymphs Abs 2.9 0.7 - 4.0 K/uL   Monocytes Relative 4 %   Monocytes Absolute 0.4 0.1 - 1.0 K/uL   Eosinophils Relative 1 %   Eosinophils Absolute 0.1 0.0 - 0.5 K/uL   Basophils Relative 1 %   Basophils Absolute 0.1 0.0 - 0.1 K/uL    Immature Granulocytes 1 %   Abs Immature Granulocytes 0.04 0.00 - 0.07 K/uL    Comment: Performed at [facility], [address], [place], [place] [address]  Magnesium     Status: None   Collection Time: 04/07/21  3:01 PM  Result Value Ref Range   Magnesium 1.9 1.7 - 2.4 mg/dL    Comment: Performed at [facility], [address] [place], [place] [address]  Hemoglobin A1c     Status: Abnormal   Collection Time: 04/07/21  3:01 PM  Result Value Ref Range   Hgb A1c MFr Bld 11.1 (H) 4.8 - 5.6 %    Comment: (NOTE) Pre diabetes:          5.7%-6.4%  Diabetes:              >6.4%  Glycemic control for   <7.0% adults with diabetes    Mean Plasma Glucose 271.87 mg/dL    Comment: Performed at [facility], [address], [place], [place] [address]  I-Stat beta hCG blood, ED     Status: None   Collection Time: 04/07/21  3:08 PM  Result Value Ref Range   I-stat hCG, quantitative <5.0 <5 mIU/mL   Comment 3            Comment:   GEST. AGE      CONC.  (mIU/mL)   <=1 WEEK        5 - 50     2 WEEKS       50 - 500     3 WEEKS       100 - 10,000     4 WEEKS     1,000 - 30,000        FEMALE AND NON-PREGNANT FEMALE:     LESS THAN 5 mIU/mL   Urinalysis, Routine w reflex microscopic Urine, Clean Catch     Status: Abnormal   Collection Time: 04/07/21  4:06 PM  Result Value Ref Range   Color, Urine YELLOW YELLOW   APPearance CLEAR CLEAR  Specific Gravity, Urine 1.040 (H) 1.005 - 1.030   pH 6.0 5.0 - 8.0   Glucose, UA >=500 (A) NEGATIVE mg/dL   Hgb urine dipstick NEGATIVE NEGATIVE   Bilirubin Urine NEGATIVE NEGATIVE   Ketones, ur 80 (A) NEGATIVE mg/dL   Protein, ur 30 (A) NEGATIVE mg/dL   Nitrite NEGATIVE NEGATIVE   Leukocytes,Ua SMALL (A) NEGATIVE   RBC / HPF 11-20 0 - 5 RBC/hpf   WBC, UA 0-5 0 - 5 WBC/hpf   Bacteria, UA RARE (A) NONE SEEN   Squamous Epithelial / LPF 0-5 0 - 5   Mucus PRESENT    Urine-Other TRICHOMONAS PRESENT     Comment:  Performed at [facility], [address], [place], [place] [address]  Resp Panel by RT-PCR (Flu A&B, Covid) Nasopharyngeal Swab     Status: None   Collection Time: 04/07/21  4:06 PM   Specimen: Nasopharyngeal Swab; Nasopharyngeal(NP) swabs in vial transport medium  Result Value Ref Range   SARS Coronavirus 2 by RT PCR NEGATIVE NEGATIVE    Comment: (NOTE) SARS-CoV-2 target nucleic acids are NOT DETECTED.  The SARS-CoV-2 RNA is generally detectable in upper respiratory specimens during the acute phase of infection. The lowest concentration of SARS-CoV-2 viral copies this assay can detect is 138 copies/mL. A negative result does not preclude SARS-Cov-2 infection and should not be used as the sole basis for treatment or other patient management decisions. A negative result may occur with  improper specimen collection/handling, submission of specimen other than nasopharyngeal swab, presence of viral mutation(s) within the areas targeted by this assay, and inadequate number of viral copies(<138 copies/mL). A negative result must be combined with clinical observations, patient history, and epidemiological information. The expected result is Negative.  Fact Sheet for Patients:  BloggerCourse.com  Fact Sheet for Healthcare Providers:  SeriousBroker.it  This test is no t yet approved or cleared by the [place] FDA and  has been authorized for detection and/or diagnosis of SARS-CoV-2 by FDA under an Emergency Use Authorization (EUA). This EUA will remain  in effect (meaning this test can be used) for the duration of the COVID-19 declaration under Section 564(b)(1) of the Act, 21 U.S.C.section 360bbb-3(b)(1), unless the authorization is terminated  or revoked sooner.       Influenza A by PCR NEGATIVE NEGATIVE   Influenza B by PCR NEGATIVE NEGATIVE    Comment: (NOTE) The Xpert Xpress SARS-CoV-2/FLU/RSV plus assay is  intended as an aid in the diagnosis of influenza from Nasopharyngeal swab specimens and should not be used as a sole basis for treatment. Nasal washings and aspirates are unacceptable for Xpert Xpress SARS-CoV-2/FLU/RSV testing.  Fact Sheet for Patients: BloggerCourse.com  Fact Sheet for Healthcare Providers: SeriousBroker.it  This test is not yet approved or cleared by the [place] FDA and has been authorized for detection and/or diagnosis of SARS-CoV-2 by FDA under an Emergency Use Authorization (EUA). This EUA will remain in effect (meaning this test can be used) for the duration of the COVID-19 declaration under Section 564(b)(1) of the Act, 21 U.S.C. section 360bbb-3(b)(1), unless the authorization is terminated or revoked.  Performed at [facility], [address] [place], [place] [address]   POC CBG, ED     Status: Abnormal   Collection Time: 04/07/21  9:32 PM  Result Value Ref Range   Glucose-Capillary 326 (H) 70 - 99 mg/dL    Comment: Glucose reference range applies only to samples taken after fasting for at least  8 hours.  Glucose, capillary     Status: Abnormal   Collection Time: 04/08/21 12:20 AM  Result Value Ref Range   Glucose-Capillary 222 (H) 70 - 99 mg/dL    Comment: Glucose reference range applies only to samples taken after fasting for at least 8 hours.  Magnesium     Status: None   Collection Time: 04/08/21  4:01 AM  Result Value Ref Range   Magnesium 2.0 1.7 - 2.4 mg/dL    Comment: Performed at [facility], [address], [place], [place] [address]  CBC WITH DIFFERENTIAL     Status: Abnormal   Collection Time: 04/08/21  4:01 AM  Result Value Ref Range   WBC 14.7 (H) 4.0 - 10.5 K/uL   RBC 4.73 3.87 - 5.11 MIL/uL   Hemoglobin 14.6 12.0 - 15.0 g/dL   HCT 92.1 19.4 - 17.4 %   MCV 90.1 80.0 - 100.0 fL   MCH 30.9 26.0 - 34.0 pg   MCHC 34.3 30.0 - 36.0 g/dL    RDW 08.1 44.8 - 18.5 %   Platelets 272 150 - 400 K/uL   nRBC 0.0 0.0 - 0.2 %   Neutrophils Relative % 77 %   Neutro Abs 11.3 (H) 1.7 - 7.7 K/uL   Lymphocytes Relative 18 %   Lymphs Abs 2.7 0.7 - 4.0 K/uL   Monocytes Relative 4 %   Monocytes Absolute 0.6 0.1 - 1.0 K/uL   Eosinophils Relative 0 %   Eosinophils Absolute 0.0 0.0 - 0.5 K/uL   Basophils Relative 0 %   Basophils Absolute 0.0 0.0 - 0.1 K/uL   Immature Granulocytes 1 %   Abs Immature Granulocytes 0.08 (H) 0.00 - 0.07 K/uL    Comment: Performed at [facility], [address], [place], [place] [address]  Comprehensive metabolic panel     Status: Abnormal   Collection Time: 04/08/21  4:01 AM  Result Value Ref Range   Sodium 142 135 - 145 mmol/L   Potassium 4.6 3.5 - 5.1 mmol/L    Comment: DELTA CHECK NOTED   Chloride 107 98 - 111 mmol/L   CO2 21 (L) 22 - 32 mmol/L   Glucose, Bld 231 (H) 70 - 99 mg/dL    Comment: Glucose reference range applies only to samples taken after fasting for at least 8 hours.   BUN 10 6 - 20 mg/dL   Creatinine, Ser 7.02 0.44 - 1.00 mg/dL   Calcium 9.8 8.9 - 63.7 mg/dL   Total Protein 7.8 6.5 - 8.1 g/dL   Albumin 4.2 3.5 - 5.0 g/dL   AST 27 15 - 41 U/L   ALT 34 0 - 44 U/L   Alkaline Phosphatase 79 38 - 126 U/L   Total Bilirubin 1.0 0.3 - 1.2 mg/dL   GFR, Estimated >85 >88 mL/min    Comment: (NOTE) Calculated using the CKD-EPI Creatinine Equation (2021)    Anion gap 14 5 - 15    Comment: Performed at [facility], [address] [place], [place] [address]  Lipase, blood     Status: None   Collection Time: 04/08/21  4:01 AM  Result Value Ref Range   Lipase 26 11 - 51 U/L    Comment: Performed at [facility], [address], [place], [place] [address]  Protime-INR     Status: None   Collection Time: 04/08/21  4:01 AM  Result Value Ref Range   Prothrombin Time 13.3 11.4 -  15.2 seconds   INR 1.0 0.8 - 1.2    Comment: (NOTE) INR goal  varies based on device and disease states. Performed at [facility], [address] [place], [place] [address]   Surgical pcr screen     Status: Abnormal   Collection Time: 04/08/21  5:15 AM   Specimen: Nasal Mucosa; Nasal Swab  Result Value Ref Range   MRSA, PCR NEGATIVE NEGATIVE   Staphylococcus aureus POSITIVE (A) NEGATIVE    Comment: (NOTE) The Xpert SA Assay (FDA approved for NASAL specimens in patients 39 years of age and older), is one component of a comprehensive surveillance program. It is not intended to diagnose infection nor to guide or monitor treatment. Performed at [facility], [address], [place], [place] [address]   Glucose, capillary     Status: Abnormal   Collection Time: 04/08/21  6:01 AM  Result Value Ref Range   Glucose-Capillary 247 (H) 70 - 99 mg/dL    Comment: Glucose reference range applies only to samples taken after fasting for at least 8 hours.   CT Abdomen Pelvis W Contrast  Result Date: 04/07/2021 CLINICAL DATA:  Left upper quadrant abdominal pain EXAM: CT ABDOMEN AND PELVIS WITH CONTRAST TECHNIQUE: Multidetector CT imaging of the abdomen and pelvis was performed using the standard protocol following bolus administration of intravenous contrast. CONTRAST:  OMNIPAQUE IOHEXOL 300 MG/ML SOLN, <See Chart> OMNIPAQUE IOHEXOL 300 MG/ML SOLN COMPARISON:  Abdominal ultrasound 04/07/2021 FINDINGS: The patient was apparently unable to raise the right arm during imaging, resulting in mild reduction of signal to noise ratio. Lower chest: Linear subsegmental atelectasis or scarring in the right middle lobe and posterior basal segment of the left lower lobe. Mild distal esophageal wall thickening, esophagitis would be the most common cause. Hepatobiliary: Multiple gallstones in the gallbladder likely with sludge in the gallbladder as well. No pericholecystic fluid identified. Focal steatosis in segment 4b of the liver  adjacent to the falciform ligament. Pancreas: Unremarkable Spleen: Unremarkable Adrenals/Urinary Tract: Unremarkable Stomach/Bowel: Unremarkable.  Normal appendix.  No dilated bowel. Vascular/Lymphatic: Aortoiliac atherosclerotic vascular disease. No pathologic adenopathy. Reproductive: Unremarkable Other: No supplemental non-categorized findings. Musculoskeletal: Unremarkable IMPRESSION: 1. Cholelithiasis. 2. Wall thickening in the distal esophagus, esophagitis would be the most common cause. 3.  Aortic Atherosclerosis (ICD10-I70.0). 4. No specific left upper quadrant finding to fully explain the patient's abdominal pain. Electronically Signed   By: Gaylyn Rong M.D.   On: 04/07/2021 20:22   US Abdomen Limited RUQ (LIVER/GB)  Result Date: 04/07/2021 CLINICAL DATA:  Abdominal pain. EXAM: ULTRASOUND ABDOMEN LIMITED RIGHT UPPER QUADRANT COMPARISON:  Ultrasound, 03/08/2021. FINDINGS: Gallbladder: Gallbladder is filled with stones and sludge. No wall thickening or pericholecystic fluid. Positive sonographic Murphy's sign. Common bile duct: Diameter: 4 mm. Liver: No focal lesion identified. Within normal limits in parenchymal echogenicity. Portal vein is patent on color Doppler imaging with normal direction of blood flow towards the liver. Other: None. IMPRESSION: 1. Gallbladder filled with sludge and stones with a positive sonographic Murphy sign, but no wall thickening or pericholecystic fluid. This appearance is stable from the prior study. No convincing acute cholecystitis. 2. Normal liver.  No bile duct dilation. Electronically Signed   By: Amie Portland M.D.   On: 04/07/2021 16:07   Anti-infectives (From admission, onward)   Start     Dose/Rate Route Frequency Ordered Stop   04/08/21 1000  metroNIDAZOLE (FLAGYL) tablet 500 mg        500 mg Oral  Every 12 hours 04/08/21 0818     04/08/21 0915  cefTRIAXone (ROCEPHIN) 2 g in sodium chloride 0.9 % 100 mL IVPB        2 g 200 mL/hr over 30 Minutes  Intravenous Every 24 hours 04/08/21 0818         Assessment/Plan Trichomonas  - noted on UA. Last sexual activity Dec. Started on Flagyl. Advised she can not drink alcohol while taking this medication if discharged on it. Defer to [facility] for further testing and tx. Possible esophagitis - CT noted wall thickening of the distal esophagus with possible esophagitis. On PPI already.   Symptomatic cholelithiasis with possible acute cholecystitis  I have explained the procedure, risks, and aftercare of Laparoscopic cholecystectomy.  Risks include but are not limited to anesthesia (MI, CVA, death), bleeding, infection, wound problems, hernia, bile leak, injury to common bile duct/liver/intestine and diarrhea post op.  She seems to understand and agrees to proceed. Keep NPO. IV Rocephin ordered.   FEN - NPO, IVF per [facility] VTE - SCDs, okay for chemical prophylaxis from general surgery standpoint ID - Rocephin/Flagyl  Jacinto HalimMichael M Naheim Burgen, [facility] 04/08/2021, 8:18 AM Please see Amion for pager number during day hours 7:00am-4:30pm

## 2021-04-08 NOTE — Progress Notes (Signed)
PROGRESS NOTE    [name]  ZJQ:[id number] DOB: [birth year] DOA: 04/07/2021 PCP: Patient, No Pcp Per (Inactive)   Brief Narrative:  MAKYNA Cline is a 45 y.o. female with medical history significant for cholelithiasis , recurrent biliary colic, GERD, who is admitted to [facility] on 04/07/2021 with suspected acute cholecystitis after presenting from home to [facility] ED complaining of abdominal pain. Patient is well known to [facility] as an outpatient. Reports worsening right upper quadrant abdominal pain has significantly intensified over the last 2 days, noting much worse intensity as well as increasing frequency of the associated discomfort.  She also notes associated nausea on intermittent basis over that timeframe, resulting in at least 5-6 episodes of nonbloody, nonbilious emesis resulting in her reported inability to tolerate p.o., including unable to tolerate oral antiemetic or analgesic interventions. Admitted for follow up with surgery for acute cholecystitis.   Assessment & Plan:   Principal Problem:   Cholecystitis Active Problems:   Abdominal pain   Hypokalemia   Hyperglycemia   Dehydration   GERD (gastroesophageal reflux disease)   Tobacco abuse   Nausea & vomiting   Acute cholecystitis secondary to cholelithiasis, POA:  -General surgery following, plan for cholecystectomy later today  -Pain currently well controlled -Currently n.p.o., postoperatively will advance diet as tolerated -Continue IV fluids until postoperative p.o. intake improves  Mild hypokalemia, resolved:  -Follow morning labs  Newly diagnosed diabetes, likely type II - A1c 11.1 indicating profoundly uncontrolled glucose -Continue sliding scale insulin, will likely discharge on insulin based on this A1c alone -Continue to follow glucose levels in house  Trichomoniasis  -Incidentally noted on intake urine -Flagyl twice daily x7 days per current guidelines  GERD:  On  famotidine as an outpatient.  Chronic tobacco abuse Counseled the patient on the importance of complete smoking discontinuation.  DVT prophylaxis: SCDs perioperatively Code Status: Full code Family Communication: none Status is: Inpatient  Dispo: The patient is from: Home              Anticipated d/c is to: Home              Anticipated d/c date is: 24 to 48 hours pending clinical course              Patient currently not medically stable for discharge  Consultants:   General surgery  Procedures:   Cholecystectomy  Antimicrobials:  Metronidazole x7 days  Subjective: No acute issues or events overnight, pain moderately well controlled, nausea vomiting symptoms improved but not resolved.  Otherwise denies headache fever chills chest pain shortness of breath.  Objective: Vitals:   04/07/21 2244 04/07/21 2322 04/08/21 0124 04/08/21 0605  BP:  117/81 (!) 151/84 (!) 148/75  Pulse:  84 88 82  Resp:  17 14 15   Temp: 98 F (36.7 C) 98.4 F (36.9 C) 98.1 F (36.7 C) 98.5 F (36.9 C)  TempSrc: Oral Oral    SpO2:  100% 100% 100%    Intake/Output Summary (Last 24 hours) at 04/08/2021 0700 Last data filed at 04/08/2021 0600 Gross per 24 hour  Intake 968.69 ml  Output --  Net 968.69 ml   There were no vitals filed for this visit.  Examination:  General:  Pleasantly resting in bed, No acute distress. HEENT:  Normocephalic atraumatic.  Sclerae nonicteric, noninjected.  Extraocular movements intact bilaterally. Neck:  Without mass or deformity.  Trachea is midline. Lungs:  Clear to auscultate bilaterally without rhonchi, wheeze, or rales. Heart:  Regular rate and rhythm.  Without murmurs, rubs, or gallops. Abdomen:  Soft, nontender, nondistended.  Without guarding or rebound. Extremities: Without cyanosis, clubbing, edema, or obvious deformity. Vascular:  Dorsalis pedis and posterior tibial pulses palpable bilaterally. Skin:  Warm and dry, no erythema, no  ulcerations.   Data Reviewed: I have personally reviewed following labs and imaging studies  CBC: Recent Labs  Lab 04/07/21 1501 04/08/21 0401  WBC 8.8 14.7*  NEUTROABS 5.4 11.3*  HGB 15.6* 14.6  HCT 45.3 42.6  MCV 91.0 90.1  PLT 261 272   Basic Metabolic Panel: Recent Labs  Lab 04/07/21 1501 04/08/21 0401  NA 136 142  K 3.4* 4.6  CL 100 107  CO2 22 21*  GLUCOSE 377* 231*  BUN 10 10  CREATININE 0.67 0.50  CALCIUM 9.5 9.8  MG 1.9 2.0   GFR: CrCl cannot be calculated (Unknown ideal weight.). Liver Function Tests: Recent Labs  Lab 04/07/21 1501 04/08/21 0401  AST 33 27  ALT 36 34  ALKPHOS 80 79  BILITOT 0.9 1.0  PROT 8.3* 7.8  ALBUMIN 4.1 4.2   Recent Labs  Lab 04/07/21 1501 04/08/21 0401  LIPASE 32 26   No results for input(s): AMMONIA in the last 168 hours. Coagulation Profile: Recent Labs  Lab 04/08/21 0401  INR 1.0   Cardiac Enzymes: No results for input(s): CKTOTAL, CKMB, CKMBINDEX, TROPONINI in the last 168 hours. BNP (last 3 results) No results for input(s): PROBNP in the last 8760 hours. HbA1C: Recent Labs    04/07/21 1501  HGBA1C 11.1*   CBG: Recent Labs  Lab 04/07/21 2132 04/08/21 0020 04/08/21 0601  GLUCAP 326* 222* 247*   Lipid Profile: No results for input(s): CHOL, HDL, LDLCALC, TRIG, CHOLHDL, LDLDIRECT in the last 72 hours. Thyroid Function Tests: No results for input(s): TSH, T4TOTAL, FREET4, T3FREE, THYROIDAB in the last 72 hours. Anemia Panel: No results for input(s): VITAMINB12, FOLATE, FERRITIN, TIBC, IRON, RETICCTPCT in the last 72 hours. Sepsis Labs: No results for input(s): PROCALCITON, LATICACIDVEN in the last 168 hours.  Recent Results (from the past 240 hour(s))  Resp Panel by RT-PCR (Flu A&B, Covid) Nasopharyngeal Swab     Status: None   Collection Time: 04/07/21  4:06 PM   Specimen: Nasopharyngeal Swab; Nasopharyngeal(NP) swabs in vial transport medium  Result Value Ref Range Status   SARS Coronavirus  2 by RT PCR NEGATIVE NEGATIVE Final    Comment: (NOTE) SARS-CoV-2 target nucleic acids are NOT DETECTED.  The SARS-CoV-2 RNA is generally detectable in upper respiratory specimens during the acute phase of infection. The lowest concentration of SARS-CoV-2 viral copies this assay can detect is 138 copies/mL. A negative result does not preclude SARS-Cov-2 infection and should not be used as the sole basis for treatment or other patient management decisions. A negative result may occur with  improper specimen collection/handling, submission of specimen other than nasopharyngeal swab, presence of viral mutation(s) within the areas targeted by this assay, and inadequate number of viral copies(<138 copies/mL). A negative result must be combined with clinical observations, patient history, and epidemiological information. The expected result is Negative.  Fact Sheet for Patients:  BloggerCourse.comhttps://www.fda.gov/media/152166/download  Fact Sheet for Healthcare Providers:  SeriousBroker.ithttps://www.fda.gov/media/152162/download  This test is no t yet approved or cleared by the [place] FDA and  has been authorized for detection and/or diagnosis of SARS-CoV-2 by FDA under an Emergency Use Authorization (EUA). This EUA will remain  in effect (meaning this test can be used) for the duration of  the COVID-19 declaration under Section 564(b)(1) of the Act, 21 U.S.C.section 360bbb-3(b)(1), unless the authorization is terminated  or revoked sooner.       Influenza A by PCR NEGATIVE NEGATIVE Final   Influenza B by PCR NEGATIVE NEGATIVE Final    Comment: (NOTE) The Xpert Xpress SARS-CoV-2/FLU/RSV plus assay is intended as an aid in the diagnosis of influenza from Nasopharyngeal swab specimens and should not be used as a sole basis for treatment. Nasal washings and aspirates are unacceptable for Xpert Xpress SARS-CoV-2/FLU/RSV testing.  Fact Sheet for Patients: BloggerCourse.com  Fact  Sheet for Healthcare Providers: SeriousBroker.it  This test is not yet approved or cleared by the [place] FDA and has been authorized for detection and/or diagnosis of SARS-CoV-2 by FDA under an Emergency Use Authorization (EUA). This EUA will remain in effect (meaning this test can be used) for the duration of the COVID-19 declaration under Section 564(b)(1) of the Act, 21 U.S.C. section 360bbb-3(b)(1), unless the authorization is terminated or revoked.  Performed at [facility], [address] [place], [place] [address]          Radiology Studies: CT Abdomen Pelvis W Contrast  Result Date: 04/07/2021 CLINICAL DATA:  Left upper quadrant abdominal pain EXAM: CT ABDOMEN AND PELVIS WITH CONTRAST TECHNIQUE: Multidetector CT imaging of the abdomen and pelvis was performed using the standard protocol following bolus administration of intravenous contrast. CONTRAST:  OMNIPAQUE IOHEXOL 300 MG/ML SOLN, <See Chart> OMNIPAQUE IOHEXOL 300 MG/ML SOLN COMPARISON:  Abdominal ultrasound 04/07/2021 FINDINGS: The patient was apparently unable to raise the right arm during imaging, resulting in mild reduction of signal to noise ratio. Lower chest: Linear subsegmental atelectasis or scarring in the right middle lobe and posterior basal segment of the left lower lobe. Mild distal esophageal wall thickening, esophagitis would be the most common cause. Hepatobiliary: Multiple gallstones in the gallbladder likely with sludge in the gallbladder as well. No pericholecystic fluid identified. Focal steatosis in segment 4b of the liver adjacent to the falciform ligament. Pancreas: Unremarkable Spleen: Unremarkable Adrenals/Urinary Tract: Unremarkable Stomach/Bowel: Unremarkable.  Normal appendix.  No dilated bowel. Vascular/Lymphatic: Aortoiliac atherosclerotic vascular disease. No pathologic adenopathy. Reproductive: Unremarkable Other: No supplemental  non-categorized findings. Musculoskeletal: Unremarkable IMPRESSION: 1. Cholelithiasis. 2. Wall thickening in the distal esophagus, esophagitis would be the most common cause. 3.  Aortic Atherosclerosis (ICD10-I70.0). 4. No specific left upper quadrant finding to fully explain the patient's abdominal pain. Electronically Signed   By: Gaylyn Rong M.D.   On: 04/07/2021 20:22   US Abdomen Limited RUQ (LIVER/GB)  Result Date: 04/07/2021 CLINICAL DATA:  Abdominal pain. EXAM: ULTRASOUND ABDOMEN LIMITED RIGHT UPPER QUADRANT COMPARISON:  Ultrasound, 03/08/2021. FINDINGS: Gallbladder: Gallbladder is filled with stones and sludge. No wall thickening or pericholecystic fluid. Positive sonographic Murphy's sign. Common bile duct: Diameter: 4 mm. Liver: No focal lesion identified. Within normal limits in parenchymal echogenicity. Portal vein is patent on color Doppler imaging with normal direction of blood flow towards the liver. Other: None. IMPRESSION: 1. Gallbladder filled with sludge and stones with a positive sonographic Murphy sign, but no wall thickening or pericholecystic fluid. This appearance is stable from the prior study. No convincing acute cholecystitis. 2. Normal liver.  No bile duct dilation. Electronically Signed   By: Amie Portland M.D.   On: 04/07/2021 16:07     Scheduled Meds: . Chlorhexidine Gluconate Cloth  6 each Topical Once  . [START ON 04/09/2021] feeding supplement  296 mL Oral Once  . insulin aspart  0-9 Units Subcutaneous Q6H  . pantoprazole (PROTONIX) IV  40 mg Intravenous Q24H   Continuous Infusions: . lactated ringers 100 mL/hr at 04/08/21 0018     LOS: 1 day   Time spent:  Azucena Fallen, DO [facility]  If 7PM-7AM, please contact night-coverage [email or web address]  04/08/2021, 7:00 AM

## 2021-04-08 NOTE — Transfer of Care (Signed)
Immediate Anesthesia Transfer of Care Note  Patient: [name]  Procedure(s) Performed: Procedure(s): LAPAROSCOPIC CHOLECYSTECTOMy (N/A)  Patient Location: PACU  Anesthesia Type:General  Level of Consciousness:  sedated, patient cooperative and responds to stimulation  Airway & Oxygen Therapy:Patient Spontanous Breathing and Patient connected to face mask oxgen  Post-op Assessment:  Report given to PACU RN and Post -op Vital signs reviewed and stable  Post vital signs:  Reviewed and stable  Last Vitals:  Vitals:   04/08/21 0605 04/08/21 1126  BP: (!) 148/75 137/89  Pulse: 82 73  Resp: 15 16  Temp: 36.9 C 37.1 C  SpO2: 100% 100%    Complications: No apparent anesthesia complications

## 2021-04-08 NOTE — Progress Notes (Addendum)
Inpatient Diabetes Program Recommendations  AACE/ADA: New Consensus Statement on Inpatient Glycemic Control (2015)  Target Ranges:  Prepandial:   less than 140 mg/dL      Peak postprandial:   less than 180 mg/dL (1-2 hours)      Critically ill patients:  140 - 180 mg/dL   Lab Results  Component Value Date   GLUCAP 247 (H) 04/08/2021   HGBA1C 11.1 (H) 04/07/2021    Review of Glycemic Control  Diabetes history: New-onset DM Outpatient Diabetes medications: None Current orders for Inpatient glycemic control: Novolog 0-9 units Q6H  Ordered Living Well with Diabetes book HgbA1C - 11.1% - new onset  Inpatient Diabetes Program Recommendations:     Will need to f/u with PCP regarding new diagnosis DM as soon as possible. Will likely need to be on insulin.  Pt in PACU with discharge orders to go home. Secure text to Dr Avon Gully regarding this new diabetes diagnosis.  Thank you. [name], RD, [name], [name] Inpatient Diabetes Coordinator [phone]  Addendum:  Educated patient on insulin pen use at home. Reviewed contents of insulin flexpen starter kit. Reviewed all steps if insulin pen including attachment of needle, 2-unit air shot, dialing up dose, giving injection, removing needle, disposal of sharps, storage of unused insulin, disposal of insulin etc. Patient able to provide successful return demonstration. Also reviewed troubleshooting with insulin pen. MD to give patient Rxs for insulin pens and insulin pen needles.  Pt to be discharged on Novolin 70/30 8 units BID. Discussed hypoglycemia s/s and treatment.  Instructed to check blood sugars at least 2-3x/day. Has appt with McKenzie with new PCP for diabetes management. Answered all questions.  RV

## 2021-04-08 NOTE — Op Note (Signed)
Laparoscopic Cholecystectomy Procedure Note  Indications: This patient presents with symptomatic gallbladder disease and will undergo laparoscopic cholecystectomy.  Pre-operative Diagnosis: cholecystitis with cholelithiasis  Post-operative Diagnosis: Same  Surgeon: Abigail Miyamoto   Assistants: Myrtie Soman RNFA  Anesthesia: General endotracheal anesthesia  ASA Class: 2  Procedure Details  The patient was seen again in the Holding Room. The risks, benefits, complications, treatment options, and expected outcomes were discussed with the patient. The possibilities of reaction to medication, pulmonary aspiration, perforation of viscus, bleeding, recurrent infection, finding a normal gallbladder, the need for additional procedures, failure to diagnose a condition, the possible need to convert to an open procedure, and creating a complication requiring transfusion or operation were discussed with the patient. The likelihood of improving the patient's symptoms with return to their baseline status is good.  The patient and/or family concurred with the proposed plan, giving informed consent. The site of surgery properly noted. The patient was taken to Operating Room, identified as [name] and the procedure verified as Laparoscopic Cholecystectomy with Intraoperative Cholangiogram. A Time Out was held and the above information confirmed.  Prior to the induction of general anesthesia, antibiotic prophylaxis was administered. General endotracheal anesthesia was then administered and tolerated well. After the induction, the abdomen was prepped with Chloraprep and draped in sterile fashion. The patient was positioned in the supine position.  Local anesthetic agent was injected into the skin near the umbilicus and an incision made. We dissected down to the abdominal fascia with blunt dissection.  The fascia was incised vertically and we entered the peritoneal cavity bluntly.  A pursestring  suture of 0-Vicryl was placed around the fascial opening.  The Hasson cannula was inserted and secured with the stay suture.  Pneumoperitoneum was then created with CO2 and tolerated well without any adverse changes in the patient's vital signs. A 5-mm port was placed in the subxiphoid position.  Two 5-mm ports were placed in the right upper quadrant. All skin incisions were infiltrated with a local anesthetic agent before making the incision and placing the trocars.   We positioned the patient in reverse Trendelenburg, tilted slightly to the patient's left.  The gallbladder was identified, the fundus grasped and retracted cephalad. Adhesions were lysed bluntly and with the electrocautery where indicated, taking care not to injure any adjacent organs or viscus. The infundibulum was grasped and retracted laterally, exposing the peritoneum overlying the triangle of Calot. This was then divided and exposed in a blunt fashion. The cystic duct was clearly identified and bluntly dissected circumferentially. A critical view of the cystic duct and cystic artery was obtained.  The cystic duct was then ligated with clips and divided. The cystic artery was, dissected free, ligated with clips and divided as well.   The gallbladder was dissected from the liver bed in retrograde fashion with the electrocautery. The gallbladder was removed and placed in an Endocatch sac. The liver bed was irrigated and inspected. Hemostasis was achieved with the electrocautery. Copious irrigation was utilized and was repeatedly aspirated until clear.  The gallbladder and Endocatch sac were then removed through the umbilical port site.  The pursestring suture was used to close the umbilical fascia.    We again inspected the right upper quadrant for hemostasis.  Pneumoperitoneum was released as we removed the trocars.  4-0 Monocryl was used to close the skin.   Benzoin, steri-strips, and clean dressings were applied. The patient was then  extubated and brought to the recovery room in stable condition. Instrument,  sponge, and needle counts were correct at closure and at the conclusion of the case.   Findings: Cholecystitis with Cholelithiasis  Estimated Blood Loss: Minimal         Drains: 0         Specimens: Gallbladder           Complications: None; patient tolerated the procedure well.         Disposition: PACU - hemodynamically stable.         Condition: stable

## 2021-04-08 NOTE — Progress Notes (Signed)
Pt transported to room and ambulated to chiar.  Pt tolerated very well.

## 2021-04-09 ENCOUNTER — Encounter (HOSPITAL_COMMUNITY): Payer: Self-pay | Admitting: Surgery

## 2021-04-09 LAB — CBC
HCT: 41.8 % (ref 36.0–46.0)
Hemoglobin: 14.7 g/dL (ref 12.0–15.0)
MCH: 31.3 pg (ref 26.0–34.0)
MCHC: 35.2 g/dL (ref 30.0–36.0)
MCV: 88.9 fL (ref 80.0–100.0)
Platelets: 207 10*3/uL (ref 150–400)
RBC: 4.7 MIL/uL (ref 3.87–5.11)
RDW: 11.6 % (ref 11.5–15.5)
WBC: 12.7 10*3/uL — ABNORMAL HIGH (ref 4.0–10.5)
nRBC: 0 % (ref 0.0–0.2)

## 2021-04-09 LAB — GLUCOSE, CAPILLARY
Glucose-Capillary: 313 mg/dL — ABNORMAL HIGH (ref 70–99)
Glucose-Capillary: 212 mg/dL — ABNORMAL HIGH (ref 70–99)
Glucose-Capillary: 163 mg/dL — ABNORMAL HIGH (ref 70–99)
Glucose-Capillary: 288 mg/dL — ABNORMAL HIGH (ref 70–99)
Glucose-Capillary: 224 mg/dL — ABNORMAL HIGH (ref 70–99)

## 2021-04-09 LAB — BASIC METABOLIC PANEL
Anion gap: 11 (ref 5–15)
BUN: 10 mg/dL (ref 6–20)
CO2: 24 mmol/L (ref 22–32)
Calcium: 9.3 mg/dL (ref 8.9–10.3)
Chloride: 102 mmol/L (ref 98–111)
Creatinine, Ser: 0.54 mg/dL (ref 0.44–1.00)
GFR, Estimated: >60 mL/min (ref >60)
Glucose, Bld: 225 mg/dL — ABNORMAL HIGH (ref 70–99)
Potassium: 4.6 mmol/L (ref 3.5–5.1)
Sodium: 137 mmol/L (ref 135–145)

## 2021-04-09 MED ORDER — POLYETHYLENE GLYCOL 3350 17 G PO PACK
17.0000 g | PACK | Freq: Two times a day (BID) | ORAL | Status: DC
  Administered 2021-04-09 – 2021-04-10 (×2): 17 g via ORAL
  Filled 2021-04-09 (×2): qty 1

## 2021-04-09 MED ORDER — ACETAMINOPHEN 325 MG PO TABS
650.0000 mg | ORAL_TABLET | Freq: Four times a day (QID) | ORAL | Status: DC | PRN
  Administered 2021-04-09: 650 mg via ORAL
  Filled 2021-04-09: qty 2

## 2021-04-09 MED ORDER — SIMETHICONE 80 MG PO CHEW
80.0000 mg | CHEWABLE_TABLET | Freq: Four times a day (QID) | ORAL | Status: DC | PRN
  Administered 2021-04-09: 80 mg via ORAL
  Filled 2021-04-09: qty 1

## 2021-04-09 MED ORDER — OXYCODONE HCL 5 MG PO TABS: 5.0000 mg | ORAL_TABLET | ORAL | 0 refills | Status: DC | PRN

## 2021-04-09 MED ORDER — OXYCODONE HCL 5 MG PO TABS
5.0000 mg | ORAL_TABLET | Freq: Four times a day (QID) | ORAL | Status: DC | PRN
  Administered 2021-04-09 – 2021-04-10 (×3): 5 mg via ORAL
  Filled 2021-04-09 (×3): qty 1

## 2021-04-09 NOTE — Discharge Instructions (Signed)
[facility]  Please arrive at least 30 min before your appointment to complete your check in paperwork.  If you are unable to arrive 30 min prior to your appointment time we may have to cancel or reschedule you. LAPAROSCOPIC SURGERY: POST OP INSTRUCTIONS Always review your discharge instruction sheet given to you by the facility where your surgery was performed. IF YOU HAVE DISABILITY OR FAMILY LEAVE FORMS, YOU MUST BRING THEM TO THE OFFICE FOR PROCESSING.   DO NOT GIVE THEM TO YOUR DOCTOR.  PAIN CONTROL  1. First take acetaminophen (Tylenol) AND/or ibuprofen (Advil) to control your pain after surgery.  Follow directions on package.  Taking acetaminophen (Tylenol) and/or ibuprofen (Advil) regularly after surgery will help to control your pain and lower the amount of prescription pain medication you may need.  You should not take more than 4,000 mg (4 grams) of acetaminophen (Tylenol) in 24 hours.  You should not take ibuprofen (Advil), aleve, motrin, naprosyn or other NSAIDS if you have a history of stomach ulcers or chronic kidney disease.  2. A prescription for pain medication may be given to you upon discharge.  Take your pain medication as prescribed, if you still have uncontrolled pain after taking acetaminophen (Tylenol) or ibuprofen (Advil). 3. Use ice packs to help control pain. 4. If you need a refill on your pain medication, please contact your pharmacy.  They will contact our office to request authorization. Prescriptions will not be filled after 5pm or on week-ends.  HOME MEDICATIONS 5. Take your usually prescribed medications unless otherwise directed.  DIET 6. You should follow a light diet the first few days after arrival home.  Be sure to include lots of fluids daily. Avoid fatty, fried foods.   CONSTIPATION 7. It is common to experience some constipation after surgery and if you are taking pain medication.  Increasing fluid intake and taking a stool  softener (such as Colace) will usually help or prevent this problem from occurring.  A mild laxative (Milk of Magnesia or Miralax) should be taken according to package instructions if there are no bowel movements after 48 hours.  WOUND/INCISION CARE 8. Most patients will experience some swelling and bruising in the area of the incisions.  Ice packs will help.  Swelling and bruising can take several days to resolve.  9. Unless discharge instructions indicate otherwise, follow guidelines below  a. STERI-STRIPS - you may remove your outer bandages 48 hours after surgery, and you may shower at that time.  You have steri-strips (small skin tapes) in place directly over the incision.  These strips should be left on the skin for 7-10 days.   b. DERMABOND/SKIN GLUE - you may shower in 24 hours.  The glue will flake off over the next 2-3 weeks. 10. Any sutures or staples will be removed at the office during your follow-up visit.  ACTIVITIES 11. You may resume regular (light) daily activities beginning the next day--such as daily self-care, walking, climbing stairs--gradually increasing activities as tolerated.  You may have sexual intercourse when it is comfortable.  Refrain from any heavy lifting or straining until approved by your doctor. a. You may drive when you are no longer taking prescription pain medication, you can comfortably wear a seatbelt, and you can safely maneuver your car and apply brakes.  FOLLOW-UP 12. You should see your doctor in the office for a follow-up appointment approximately 2-3 weeks after your surgery.  You should have been given your post-op/follow-up appointment when   your surgery was scheduled.  If you did not receive a post-op/follow-up appointment, make sure that you call for this appointment within a day or two after you arrive home to insure a convenient appointment time.   WHEN TO CALL YOUR DOCTOR: 1. Fever over 101.0 2. Inability to urinate 3. Continued bleeding from  incision. 4. Increased pain, redness, or drainage from the incision. 5. Increasing abdominal pain  The clinic staff is available to answer your questions during regular business hours.  Please don't hesitate to call and ask to speak to one of the nurses for clinical concerns.  If you have a medical emergency, go to the nearest emergency room or call 911.  A surgeon from [facility] is always on call at the hospital. [address], [address], [place], [place]  [address] ? [address] [address], [place], [place]   [address] [phone] ? [phone] ? FAX [phone]   Gallbladder Eating Plan If you have a gallbladder condition, you may have trouble digesting fats. Eating a low-fat diet can help reduce your symptoms, and may be helpful before and after having surgery to remove your gallbladder (cholecystectomy). Your health care provider may recommend that you work with a diet and nutrition specialist (dietitian) to help you reduce the amount of fat in your diet. What are tips for following this plan? General guidelines  Limit your fat intake to less than 30% of your total daily calories. If you eat around 1,800 calories each day, this is less than 60 grams (g) of fat per day.  Fat is an important part of a healthy diet. Eating a low-fat diet can make it hard to maintain a healthy body weight. Ask your dietitian how much fat, calories, and other nutrients you need each day.  Eat small, frequent meals throughout the day instead of three large meals.  Drink at least 8-10 cups of fluid a day. Drink enough fluid to keep your urine clear or pale yellow.  Limit alcohol intake to no more than 1 drink a day for nonpregnant women and 2 drinks a day for men. One drink equals 12 oz of beer, 5 oz of wine, or 1 oz of hard liquor. Reading food labels  Check Nutrition Facts on food labels for the amount of fat per serving. Choose foods with less than 3 grams of fat per serving.    Shopping  Choose nonfat and low-fat healthy foods. Look for the words "nonfat," "low fat," or "fat free."  Avoid buying processed or prepackaged foods. Cooking  Cook using low-fat methods, such as baking, broiling, grilling, or boiling.  Cook with small amounts of healthy fats, such as olive oil, grapeseed oil, canola oil, or sunflower oil. What foods are recommended?  All fresh, frozen, or canned fruits and vegetables.  Whole grains.  Low-fat or non-fat (skim) milk and yogurt.  Lean meat, skinless poultry, fish, eggs, and beans.  Low-fat protein supplement powders or drinks.  Spices and herbs. What foods are not recommended?  High-fat foods. These include baked goods, fast food, fatty cuts of meat, ice cream, french toast, sweet rolls, pizza, cheese bread, foods covered with butter, creamy sauces, or cheese.  Fried foods. These include french fries, tempura, battered fish, breaded chicken, fried breads, and sweets.  Foods with strong odors.  Foods that cause bloating and gas. Summary  A low-fat diet can be helpful if you have a gallbladder condition, or before and after gallbladder surgery.  Limit your fat intake to   less than 30% of your total daily calories. This is about 60 g of fat if you eat 1,800 calories each day.  Eat small, frequent meals throughout the day instead of three large meals. This information is not intended to replace advice given to you by your health care provider. Make sure you discuss any questions you have with your health care provider. Document Revised: 07/10/2020 Document Reviewed: 07/10/2020 Elsevier Patient Education  2021 Elsevier Inc.  

## 2021-04-09 NOTE — Progress Notes (Signed)
PROGRESS NOTE    Kayla MachoChandris R Grim  WUJ:[id number] DOB: 01/06/1976 DOA: 04/07/2021 PCP: Patient, No Pcp Per (Inactive)   Brief Narrative:  [name] is a 45 y.o. female with medical history significant for cholelithiasis , recurrent biliary colic, GERD, who is admitted to [facility] on 04/07/2021 with suspected acute cholecystitis after presenting [facility] complaining of abdominal pain. Patient is well known to [facility] as an [facility]. Reports worsening right upper quadrant abdominal pain has significantly intensified over the last 2 days, noting much worse intensity as well as increasing frequency of the associated discomfort.  She also notes associated nausea on intermittent basis over that timeframe, resulting in at least 5-6 episodes of nonbloody, nonbilious emesis resulting in her reported inability to tolerate p.o., including unable to tolerate oral antiemetic or analgesic interventions. Admitted for follow up with surgery for acute cholecystitis.   Assessment & Plan:   Principal Problem:   Cholecystitis Active Problems:   Abdominal pain   Hypokalemia   Hyperglycemia   Dehydration   GERD (gastroesophageal reflux disease)   Tobacco abuse   Nausea & vomiting   Acute cholecystitis secondary to cholelithiasis, POA:  -General surgery following, tolerated cholecystectomy on 04/08/21 -Pain currently well controlled -Continue to advance diet as tolerated -Continue IV fluids until postoperative p.o. intake improves  Mild hypokalemia, resolved:  -Follow morning labs  Newly diagnosed diabetes, likely type II - A1c 11.1 indicating profoundly uncontrolled glucose -Continue sliding scale insulin, will likely discharge on insulin based on this A1c alone -Continue to follow glucose levels in house - poor PO intake makes transitioning to glargine somewhat difficult  Trichomoniasis  -Incidentally noted on intake urine -Flagyl twice daily x7 days  per current guidelines  GERD:  On famotidine as an outpatient.  Chronic tobacco abuse Counseled the patient on the importance of complete smoking discontinuation.  DVT prophylaxis: SCDs perioperatively Code Status: Full code Family Communication: none Status is: Inpatient  Dispo: The patient is from: Home              Anticipated d/c is to: Home              Anticipated d/c date is: 24 to 48 hours pending clinical course              Patient currently not medically stable for discharge  Consultants:   General surgery  Procedures:   Cholecystectomy  Antimicrobials:  Metronidazole x7 days  Subjective: No acute issues or events overnight, pain moderately well controlled, but having difficulty taking PO  Objective: Vitals:   04/08/21 1725 04/08/21 2112 04/09/21 0140 04/09/21 0549  BP: 107/71 98/64 110/67 103/84  Pulse: 80 85 69 79  Resp: 18 15 15 16   Temp: 98.3 F (36.8 C) 98.4 F (36.9 C) 98.8 F (37.1 C) 98.3 F (36.8 C)  TempSrc: Oral Oral Oral Oral  SpO2: 100% 100% 100% 100%    Intake/Output Summary (Last 24 hours) at 04/09/2021 0657 Last data filed at 04/09/2021 0600 Gross per 24 hour  Intake 2006.67 ml  Output 700 ml  Net 1306.67 ml   There were no vitals filed for this visit.  Examination:  General:  Pleasantly resting in bed, No acute distress. HEENT:  Normocephalic atraumatic.  Sclerae nonicteric, noninjected.  Extraocular movements intact bilaterally. Neck:  Without mass or deformity.  Trachea is midline. Lungs:  Clear to auscultate bilaterally without rhonchi, wheeze, or rales. Heart:  Regular rate and rhythm.  Without murmurs,  rubs, or gallops. Abdomen:  Soft, nontender, nondistended.  Without guarding or rebound. Postop bandage clean/dry/intact Extremities: Without cyanosis, clubbing, edema, or obvious deformity. Vascular:  Dorsalis pedis and posterior tibial pulses palpable bilaterally. Skin:  Warm and dry, no erythema, no  ulcerations.   Data Reviewed: I have personally reviewed following labs and imaging studies  CBC: Recent Labs  Lab 04/07/21 1501 04/08/21 0401 04/09/21 0421  WBC 8.8 14.7* 12.7*  NEUTROABS 5.4 11.3*  --   HGB 15.6* 14.6 14.7  HCT 45.3 42.6 41.8  MCV 91.0 90.1 88.9  PLT 261 272 207   Basic Metabolic Panel: Recent Labs  Lab 04/07/21 1501 04/08/21 0401 04/09/21 0421  NA 136 142 137  K 3.4* 4.6 4.6  CL 100 107 102  CO2 22 21* 24  GLUCOSE 377* 231* 225*  BUN 10 10 10   CREATININE 0.67 0.50 0.54  CALCIUM 9.5 9.8 9.3  MG 1.9 2.0  --    GFR: CrCl cannot be calculated (Unknown ideal weight.). Liver Function Tests: Recent Labs  Lab 04/07/21 1501 04/08/21 0401  AST 33 27  ALT 36 34  ALKPHOS 80 79  BILITOT 0.9 1.0  PROT 8.3* 7.8  ALBUMIN 4.1 4.2   Recent Labs  Lab 04/07/21 1501 04/08/21 0401  LIPASE 32 26   No results for input(s): AMMONIA in the last 168 hours. Coagulation Profile: Recent Labs  Lab 04/08/21 0401  INR 1.0   Cardiac Enzymes: No results for input(s): CKTOTAL, CKMB, CKMBINDEX, TROPONINI in the last 168 hours. BNP (last 3 results) No results for input(s): PROBNP in the last 8760 hours. HbA1C: Recent Labs    04/07/21 1501  HGBA1C 11.1*   CBG: Recent Labs  Lab 04/08/21 0020 04/08/21 0601 04/08/21 1753 04/09/21 0100 04/09/21 0551  GLUCAP 222* 247* 278* 313* 212*   Lipid Profile: No results for input(s): CHOL, HDL, LDLCALC, TRIG, CHOLHDL, LDLDIRECT in the last 72 hours. Thyroid Function Tests: No results for input(s): TSH, T4TOTAL, FREET4, T3FREE, THYROIDAB in the last 72 hours. Anemia Panel: No results for input(s): VITAMINB12, FOLATE, FERRITIN, TIBC, IRON, RETICCTPCT in the last 72 hours. Sepsis Labs: No results for input(s): PROCALCITON, LATICACIDVEN in the last 168 hours.  Recent Results (from the past 240 hour(s))  Resp Panel by RT-PCR (Flu A&B, Covid) Nasopharyngeal Swab     Status: None   Collection Time: 04/07/21  4:06 PM    Specimen: Nasopharyngeal Swab; Nasopharyngeal(NP) swabs in vial transport medium  Result Value Ref Range Status   SARS Coronavirus 2 by RT PCR NEGATIVE NEGATIVE Final    Comment: (NOTE) SARS-CoV-2 target nucleic acids are NOT DETECTED.  The SARS-CoV-2 RNA is generally detectable in upper respiratory specimens during the acute phase of infection. The lowest concentration of SARS-CoV-2 viral copies this assay can detect is 138 copies/mL. A negative result does not preclude SARS-Cov-2 infection and should not be used as the sole basis for treatment or other patient management decisions. A negative result may occur with  improper specimen collection/handling, submission of specimen other than nasopharyngeal swab, presence of viral mutation(s) within the areas targeted by this assay, and inadequate number of viral copies(<138 copies/mL). A negative result must be combined with clinical observations, patient history, and epidemiological information. The expected result is Negative.  Fact Sheet for Patients:  06/07/21  Fact Sheet for Healthcare Providers:  BloggerCourse.com  This test is no t yet approved or cleared by the [place] FDA and  has been authorized for detection and/or diagnosis of SARS-CoV-2  by FDA under an Emergency Use Authorization (EUA). This EUA will remain  in effect (meaning this test can be used) for the duration of the COVID-19 declaration under Section 564(b)(1) of the Act, 21 U.S.C.section 360bbb-3(b)(1), unless the authorization is terminated  or revoked sooner.       Influenza A by PCR NEGATIVE NEGATIVE Final   Influenza B by PCR NEGATIVE NEGATIVE Final    Comment: (NOTE) The Xpert Xpress SARS-CoV-2/FLU/RSV plus assay is intended as an aid in the diagnosis of influenza from Nasopharyngeal swab specimens and should not be used as a sole basis for treatment. Nasal washings and aspirates are  unacceptable for Xpert Xpress SARS-CoV-2/FLU/RSV testing.  Fact Sheet for Patients: BloggerCourse.com  Fact Sheet for Healthcare Providers: SeriousBroker.it  This test is not yet approved or cleared by the [place] FDA and has been authorized for detection and/or diagnosis of SARS-CoV-2 by FDA under an Emergency Use Authorization (EUA). This EUA will remain in effect (meaning this test can be used) for the duration of the COVID-19 declaration under Section 564(b)(1) of the Act, 21 U.S.C. section 360bbb-3(b)(1), unless the authorization is terminated or revoked.  Performed at [facility], [address], [place], [place] [address]   Surgical pcr screen     Status: Abnormal   Collection Time: 04/08/21  5:15 AM   Specimen: Nasal Mucosa; Nasal Swab  Result Value Ref Range Status   MRSA, PCR NEGATIVE NEGATIVE Final   Staphylococcus aureus POSITIVE (A) NEGATIVE Final    Comment: (NOTE) The Xpert SA Assay (FDA approved for NASAL specimens in patients 98 years of age and older), is one component of a comprehensive surveillance program. It is not intended to diagnose infection nor to guide or monitor treatment. Performed at [facility], [address], [place], [place] [address]          Radiology Studies: CT Abdomen Pelvis W Contrast  Result Date: 04/07/2021 CLINICAL DATA:  Left upper quadrant abdominal pain EXAM: CT ABDOMEN AND PELVIS WITH CONTRAST TECHNIQUE: Multidetector CT imaging of the abdomen and pelvis was performed using the standard protocol following bolus administration of intravenous contrast. CONTRAST:  OMNIPAQUE IOHEXOL 300 MG/ML SOLN, <See Chart> OMNIPAQUE IOHEXOL 300 MG/ML SOLN COMPARISON:  Abdominal ultrasound 04/07/2021 FINDINGS: The patient was apparently unable to raise the right arm during imaging, resulting in mild reduction of signal to noise ratio. Lower  chest: Linear subsegmental atelectasis or scarring in the right middle lobe and posterior basal segment of the left lower lobe. Mild distal esophageal wall thickening, esophagitis would be the most common cause. Hepatobiliary: Multiple gallstones in the gallbladder likely with sludge in the gallbladder as well. No pericholecystic fluid identified. Focal steatosis in segment 4b of the liver adjacent to the falciform ligament. Pancreas: Unremarkable Spleen: Unremarkable Adrenals/Urinary Tract: Unremarkable Stomach/Bowel: Unremarkable.  Normal appendix.  No dilated bowel. Vascular/Lymphatic: Aortoiliac atherosclerotic vascular disease. No pathologic adenopathy. Reproductive: Unremarkable Other: No supplemental non-categorized findings. Musculoskeletal: Unremarkable IMPRESSION: 1. Cholelithiasis. 2. Wall thickening in the distal esophagus, esophagitis would be the most common cause. 3.  Aortic Atherosclerosis (ICD10-I70.0). 4. No specific left upper quadrant finding to fully explain the patient's abdominal pain. Electronically Signed   By: Gaylyn Rong M.D.   On: 04/07/2021 20:22   US Abdomen Limited RUQ (LIVER/GB)  Result Date: 04/07/2021 CLINICAL DATA:  Abdominal pain. EXAM: ULTRASOUND ABDOMEN LIMITED RIGHT UPPER QUADRANT COMPARISON:  Ultrasound, 03/08/2021. FINDINGS: Gallbladder: Gallbladder is filled with stones and sludge. No wall thickening or  pericholecystic fluid. Positive sonographic Murphy's sign. Common bile duct: Diameter: 4 mm. Liver: No focal lesion identified. Within normal limits in parenchymal echogenicity. Portal vein is patent on color Doppler imaging with normal direction of blood flow towards the liver. Other: None. IMPRESSION: 1. Gallbladder filled with sludge and stones with a positive sonographic Murphy sign, but no wall thickening or pericholecystic fluid. This appearance is stable from the prior study. No convincing acute cholecystitis. 2. Normal liver.  No bile duct dilation.  Electronically Signed   By: Amie Portland M.D.   On: 04/07/2021 16:07     Scheduled Meds: . insulin aspart  0-9 Units Subcutaneous Q6H  . metroNIDAZOLE  500 mg Oral Q12H  . mupirocin ointment  1 application Nasal BID  . pantoprazole (PROTONIX) IV  40 mg Intravenous Q24H   Continuous Infusions: . cefTRIAXone (ROCEPHIN)  IV 2 g (04/08/21 0920)  . lactated ringers 100 mL/hr at 04/09/21 0023     LOS: 2 days   Time spent:  Azucena Fallen, DO [facility]  If 7PM-7AM, please contact night-coverage [email or web address]  04/09/2021, 6:57 AM

## 2021-04-09 NOTE — Progress Notes (Signed)
1 Day Post-Op  Subjective: CC: Patient reports she is doing well.  Having some soreness around her umbilicus and epigastric incisions that is worse with moving around but feels her pain is well controlled.  Tolerating liquids without nausea, vomiting or increased abdominal pain.  Wanting to try regular food.  Mobilizing in room.  Voiding without difficulty.  Objective: Vital signs in last 24 hours: Temp:  [97.5 F (36.4 C)-98.8 F (37.1 C)] 98.3 F (36.8 C) (05/05 0549) Pulse Rate:  [69-86] 79 (05/05 0549) Resp:  [15-20] 16 (05/05 0549) BP: (98-142)/(64-92) 103/84 (05/05 0549) SpO2:  [99 %-100 %] 100 % (05/05 0549)    Intake/Output from previous day: 05/04 0701 - 05/05 0700 In: 2006.7 [P.O.:520; I.V.:1386.7; IV Piggyback:100] Out: 700 [Urine:700] Intake/Output this shift: No intake/output data recorded.  PE: Gen:  Alert, NAD, pleasant Card:  RRR Pulm:  CTAB, no W/R/R, effort normal Abd: Soft, ND, appropriately tender around laparoscopic incisions and otherwise NT without peritonitis, +BS, Incisions with glue intact appears well and are without drainage, bleeding, or signs of infection Ext:  No LE edema  Psych: A&Ox3  Skin: no rashes noted, warm and dry   Lab Results:  Recent Labs    04/08/21 0401 04/09/21 0421  WBC 14.7* 12.7*  HGB 14.6 14.7  HCT 42.6 41.8  PLT 272 207   BMET Recent Labs    04/08/21 0401 04/09/21 0421  NA 142 137  K 4.6 4.6  CL 107 102  CO2 21* 24  GLUCOSE 231* 225*  BUN 10 10  CREATININE 0.50 0.54  CALCIUM 9.8 9.3   PT/INR Recent Labs    04/08/21 0401  LABPROT 13.3  INR 1.0   CMP     Component Value Date/Time   NA 137 04/09/2021 0421   K 4.6 04/09/2021 0421   CL 102 04/09/2021 0421   CO2 24 04/09/2021 0421   GLUCOSE 225 (H) 04/09/2021 0421   BUN 10 04/09/2021 0421   CREATININE 0.54 04/09/2021 0421   CALCIUM 9.3 04/09/2021 0421   PROT 7.8 04/08/2021 0401   ALBUMIN 4.2 04/08/2021 0401   AST 27 04/08/2021 0401   ALT  34 04/08/2021 0401   ALKPHOS 79 04/08/2021 0401   BILITOT 1.0 04/08/2021 0401   GFRNONAA >60 04/09/2021 0421   GFRAA >60 10/09/2018 1154   Lipase     Component Value Date/Time   LIPASE 26 04/08/2021 0401       Studies/Results: CT Abdomen Pelvis W Contrast  Result Date: 04/07/2021 CLINICAL DATA:  Left upper quadrant abdominal pain EXAM: CT ABDOMEN AND PELVIS WITH CONTRAST TECHNIQUE: Multidetector CT imaging of the abdomen and pelvis was performed using the standard protocol following bolus administration of intravenous contrast. CONTRAST:  OMNIPAQUE IOHEXOL 300 MG/ML SOLN, <See Chart> OMNIPAQUE IOHEXOL 300 MG/ML SOLN COMPARISON:  Abdominal ultrasound 04/07/2021 FINDINGS: The patient was apparently unable to raise the right arm during imaging, resulting in mild reduction of signal to noise ratio. Lower chest: Linear subsegmental atelectasis or scarring in the right middle lobe and posterior basal segment of the left lower lobe. Mild distal esophageal wall thickening, esophagitis would be the most common cause. Hepatobiliary: Multiple gallstones in the gallbladder likely with sludge in the gallbladder as well. No pericholecystic fluid identified. Focal steatosis in segment 4b of the liver adjacent to the falciform ligament. Pancreas: Unremarkable Spleen: Unremarkable Adrenals/Urinary Tract: Unremarkable Stomach/Bowel: Unremarkable.  Normal appendix.  No dilated bowel. Vascular/Lymphatic: Aortoiliac atherosclerotic vascular disease. No pathologic adenopathy. Reproductive: Unremarkable Other:  No supplemental non-categorized findings. Musculoskeletal: Unremarkable IMPRESSION: 1. Cholelithiasis. 2. Wall thickening in the distal esophagus, esophagitis would be the most common cause. 3.  Aortic Atherosclerosis (ICD10-I70.0). 4. No specific left upper quadrant finding to fully explain the patient's abdominal pain. Electronically Signed   By: Gaylyn Rong M.D.   On: 04/07/2021 20:22   US Abdomen  Limited RUQ (LIVER/GB)  Result Date: 04/07/2021 CLINICAL DATA:  Abdominal pain. EXAM: ULTRASOUND ABDOMEN LIMITED RIGHT UPPER QUADRANT COMPARISON:  Ultrasound, 03/08/2021. FINDINGS: Gallbladder: Gallbladder is filled with stones and sludge. No wall thickening or pericholecystic fluid. Positive sonographic Murphy's sign. Common bile duct: Diameter: 4 mm. Liver: No focal lesion identified. Within normal limits in parenchymal echogenicity. Portal vein is patent on color Doppler imaging with normal direction of blood flow towards the liver. Other: None. IMPRESSION: 1. Gallbladder filled with sludge and stones with a positive sonographic Murphy sign, but no wall thickening or pericholecystic fluid. This appearance is stable from the prior study. No convincing acute cholecystitis. 2. Normal liver.  No bile duct dilation. Electronically Signed   By: Amie Portland M.D.   On: 04/07/2021 16:07    Anti-infectives: Anti-infectives (From admission, onward)   Start     Dose/Rate Route Frequency Ordered Stop   04/08/21 1000  metroNIDAZOLE (FLAGYL) tablet 500 mg        500 mg Oral Every 12 hours 04/08/21 0818 04/15/21 0959   04/08/21 0900  cefTRIAXone (ROCEPHIN) 2 g in sodium chloride 0.9 % 100 mL IVPB        2 g 200 mL/hr over 30 Minutes Intravenous Daily 04/08/21 0818         Assessment/Plan Trichomonas  - noted on UA. Last sexual activity Dec. Started on Flagyl. Advised she can not drink alcohol while taking this medication if discharged on it. Defer to [facility] for further testing and tx. Possible esophagitis - CT noted wall thickening of the distal esophagus with possible esophagitis. On PPI already.   Cholelithiasis with acute cholecystitis POD #1, s/p Laparoscopic Cholecystectomy, Dr. Magnus Ivan - 04/08/2021 - On POD 1, the patient was voiding well, tolerating a FLD, ambulating well, pain well controlled, vital signs stable, incisions c/d/i. If patient tolerates reg diet, she is felt stable for discharge home  from a general surgery standpoint. Will let Dr. Natale Milch know. I will send pain meds to her pharmacy. I have arranged follow up and placed it in the AVS.  FEN - Reg, IVF per [facility] VTE - SCDs, okay for chemical prophylaxis from general surgery standpoint ID - Rocephin/Flagyl   LOS: 2 days    Jacinto Halim , [facility] 04/09/2021, 9:02 AM Please see Amion for pager number during day hours 7:00am-4:30pm

## 2021-04-10 LAB — CBC
HCT: 39.1 % (ref 36.0–46.0)
Hemoglobin: 13.4 g/dL (ref 12.0–15.0)
MCH: 31 pg (ref 26.0–34.0)
MCHC: 34.3 g/dL (ref 30.0–36.0)
MCV: 90.5 fL (ref 80.0–100.0)
Platelets: 236 10*3/uL (ref 150–400)
RBC: 4.32 MIL/uL (ref 3.87–5.11)
RDW: 11.5 % (ref 11.5–15.5)
WBC: 13.6 10*3/uL — ABNORMAL HIGH (ref 4.0–10.5)
nRBC: 0 % (ref 0.0–0.2)

## 2021-04-10 LAB — GLUCOSE, CAPILLARY
Glucose-Capillary: 168 mg/dL — ABNORMAL HIGH (ref 70–99)
Glucose-Capillary: 179 mg/dL — ABNORMAL HIGH (ref 70–99)

## 2021-04-10 LAB — BASIC METABOLIC PANEL
Anion gap: 13 (ref 5–15)
BUN: 8 mg/dL (ref 6–20)
CO2: 22 mmol/L (ref 22–32)
Calcium: 8.8 mg/dL — ABNORMAL LOW (ref 8.9–10.3)
Chloride: 99 mmol/L (ref 98–111)
Creatinine, Ser: 0.61 mg/dL (ref 0.44–1.00)
GFR, Estimated: >60 mL/min (ref >60)
Glucose, Bld: 165 mg/dL — ABNORMAL HIGH (ref 70–99)
Potassium: 3.9 mmol/L (ref 3.5–5.1)
Sodium: 134 mmol/L — ABNORMAL LOW (ref 135–145)

## 2021-04-10 LAB — SURGICAL PATHOLOGY

## 2021-04-10 MED ORDER — BLOOD GLUCOSE MONITOR KIT: PACK | 0 refills | Status: AC

## 2021-04-10 MED ORDER — INSULIN GLARGINE 100 UNIT/ML SOLOSTAR PEN: 10.0000 [IU] | PEN_INJECTOR | Freq: Every morning | SUBCUTANEOUS | 0 refills | Status: DC

## 2021-04-10 MED ORDER — NOVOLIN 70/30 FLEXPEN RELION (70-30) 100 UNIT/ML ~~LOC~~ SUPN
8.0000 [IU] | PEN_INJECTOR | Freq: Two times a day (BID) | SUBCUTANEOUS | 0 refills | Status: DC
Start: 2021-04-10 — End: 2021-07-15

## 2021-04-10 MED ORDER — PEN NEEDLES 32G X 4 MM MISC: 1.0000 | Freq: Every day | 0 refills | Status: AC

## 2021-04-10 MED ORDER — ONDANSETRON HCL 4 MG PO TABS: 4.0000 mg | ORAL_TABLET | Freq: Every day | ORAL | 1 refills | Status: DC | PRN

## 2021-04-10 MED ORDER — KETOROLAC TROMETHAMINE 30 MG/ML IJ SOLN
30.0000 mg | Freq: Once | INTRAMUSCULAR | Status: AC
  Administered 2021-04-10: 30 mg via INTRAVENOUS
  Filled 2021-04-10: qty 1

## 2021-04-10 NOTE — TOC Transition Note (Signed)
Transition of Care [facility]) - CM/SW Discharge Note  Patient Details  Name: [name] MRN: [id number] Date of Birth: Feb 04, 1976  Transition of Care [facility]) CM/SW Contact:  Sherie Don, LCSW Phone Number: 04/10/2021, 4:04 PM  Clinical Narrative: Patient has a new diagnosis of diabetes. TOC consulted for PCP assistance. Patient is uninsured and does not have a PCP. CSW called [facility] and scheduled a hospital follow up appointment for May 21, 2021 at 2:10pm. Patient will go on a waitlist to have her appointment moved up if earlier appointments become available. Appointment added to AVS. CSW met with patient to provide her with appointment information. TOC signing off.  Final next level of care: Home/Self Care Barriers to Discharge: Barriers Resolved  Patient Goals and CMS Choice Patient states their goals for this hospitalization and ongoing recovery are:: Follow up with [facility] for new diabetes diagnosis CMS Medicare.gov Compare Post Acute Care list provided to:: Patient Choice offered to / list presented to : NA  Discharge Plan and Services        DME Arranged: N/A DME Agency: NA  Readmission Risk Interventions No flowsheet data found.

## 2021-04-10 NOTE — Progress Notes (Signed)
2 Days Post-Op   Subjective/Chief Complaint: Still with nausea but no further emesis Pain less   Objective: Vital signs in last 24 hours: Temp:  [98.1 F (36.7 C)-98.9 F (37.2 C)] 98.9 F (37.2 C) (05/05 2036) Pulse Rate:  [81-87] 81 (05/06 0533) Resp:  [16-17] 17 (05/06 0533) BP: (112-162)/(82-90) 156/82 (05/06 0533) SpO2:  [100 %] 100 % (05/06 0533) Last BM Date: 04/06/21  Intake/Output from previous day: 05/05 0701 - 05/06 0700 In: -  Out: 400 [Urine:400] Intake/Output this shift: Total I/O In: 0  Out: 300 [Urine:300]  Exam: Looks comfortable Abdomen soft, minimally tender, non-distended  Lab Results:  Recent Labs    04/09/21 0421 04/10/21 0342  WBC 12.7* 13.6*  HGB 14.7 13.4  HCT 41.8 39.1  PLT 207 236   BMET Recent Labs    04/09/21 0421 04/10/21 0342  NA 137 134*  K 4.6 3.9  CL 102 99  CO2 24 22  GLUCOSE 225* 165*  BUN 10 8  CREATININE 0.54 0.61  CALCIUM 9.3 8.8*   PT/INR Recent Labs    04/08/21 0401  LABPROT 13.3  INR 1.0   ABG No results for input(s): PHART, HCO3 in the last 72 hours.  Invalid input(s): PCO2, PO2  Studies/Results: No results found.  Anti-infectives: Anti-infectives (From admission, onward)   Start     Dose/Rate Route Frequency Ordered Stop   04/08/21 1000  metroNIDAZOLE (FLAGYL) tablet 500 mg        500 mg Oral Every 12 hours 04/08/21 0818 04/15/21 0959   04/08/21 0900  cefTRIAXone (ROCEPHIN) 2 g in sodium chloride 0.9 % 100 mL IVPB        2 g 200 mL/hr over 30 Minutes Intravenous Daily 04/08/21 0818        Assessment/Plan: s/p Procedure(s): LAPAROSCOPIC CHOLECYSTECTOMy (N/A)   POD#2 Continue to monitor nausea. Encourage ambulation Repeat labs in the morning if unable to go home later today    LOS: 3 days    Abigail Miyamoto 04/10/2021

## 2021-04-10 NOTE — Plan of Care (Signed)

## 2021-04-10 NOTE — Discharge Summary (Signed)
Physician Discharge Summary  [name] QIO:[id number] DOB: [birth year] DOA: 04/07/2021  PCP: Patient, No Pcp Per (Inactive)  Admit date: 04/07/2021 Discharge date: 04/10/2021  Admitted From: Home Disposition: Home  Recommendations for Outpatient Follow-up:  1. Follow up with PCP in 1-2 weeks 2. Please obtain BMP/CBC in one week 3. Please follow up with glucose readings  Home Health: None Equipment/Devices: None  Discharge Condition: Stable CODE STATUS: Full Diet recommendation: Diabetic diet  Brief/Interim Summary: [name] a 45 y.o.femalewith medical history significant forcholelithiasis , recurrent biliary colic, GERD,who is admitted to [facility] on 5/3/2022with suspected acute cholecystitisafter presenting from home to [facility] ED complaining of abdominal pain.Patient is well known to [facility] an outpatient.Reports worsening right upper quadrant abdominal pain has significantly intensified over the last 2 days, noting much worse intensity as well as increasing frequency of the associated discomfort.She also notes associated nausea on intermittent basis over that timeframe, resulting in at least 5-6 episodes of nonbloody, nonbilious emesis resulting in her reported inability to tolerate p.o., including unable to tolerate oral antiemetic or analgesic interventions. Admitted for follow up with surgery for acute cholecystitis.  Patient met as above with abdominal pain discomfort noted to have acute cholecystitis status post lap cholecystectomy on 04/08/2021, continued of some GI symptoms on 04/09/2021 but now tolerating p.o. well on 04/10/2021.  Of note patient had profound hyperglycemia at intake, A1c greater than 11 consistent with profoundly uncontrolled diabetes.  Lengthy discussion about need for dietary lifestyle changes.  Patient has been discharged on new insulin pen, we discussed needing to follow point-of-care glucose checks ACH S and follow-up  with PCP in 1 to 2 weeks to ensure no hyper or hypoglycemic events and to further adjust insulin as tolerated.  Patient otherwise stable and agreeable for discharge home.   Discharge Diagnoses:  Principal Problem:   Cholecystitis Active Problems:   Abdominal pain   Hypokalemia   Hyperglycemia   Dehydration   GERD (gastroesophageal reflux disease)   Tobacco abuse   Nausea & vomiting    Discharge Instructions  Discharge Instructions    Call MD for:  persistant dizziness or light-headedness   Complete by: As directed    Call MD for:  persistant nausea and vomiting   Complete by: As directed    Call MD for:  redness, tenderness, or signs of infection (pain, swelling, redness, odor or green/yellow discharge around incision site)   Complete by: As directed    Call MD for:  severe uncontrolled pain   Complete by: As directed    Call MD for:  temperature >100.4   Complete by: As directed    Diet Carb Modified   Complete by: As directed    Increase activity slowly   Complete by: As directed      Allergies as of 04/10/2021   No Known Allergies     Medication List    STOP taking these medications   albuterol 108 (90 Base) MCG/ACT inhaler Commonly known as: VENTOLIN HFA   cephALEXin 500 MG capsule Commonly known as: KEFLEX   meloxicam 7.5 MG tablet Commonly known as: MOBIC   methylPREDNISolone 4 MG Tbpk tablet Commonly known as: MEDROL DOSEPAK   omeprazole 20 MG capsule Commonly known as: PRILOSEC   ondansetron 4 MG disintegrating tablet Commonly known as: Zofran ODT   oxyCODONE-acetaminophen 5-325 MG tablet Commonly known as: PERCOCET/ROXICET   promethazine 25 MG suppository Commonly known as: PHENERGAN   promethazine 25 MG tablet Commonly known as:  PHENERGAN     TAKE these medications   blood glucose meter kit and supplies Kit Dispense based on patient and insurance preference. Use up to four times daily as directed.   famotidine 20 MG tablet Commonly  known as: PEPCID Take 1 tablet (20 mg total) by mouth 2 (two) times daily.   NovoLIN 70/30 FlexPen Relion (70-30) 100 UNIT/ML KwikPen Generic drug: insulin isophane & regular human Inject 8 Units into the skin 2 (two) times daily.   ondansetron 4 MG tablet Commonly known as: Zofran Take 1 tablet (4 mg total) by mouth daily as needed for nausea or vomiting.   oxyCODONE 5 MG immediate release tablet Commonly known as: Oxy IR/ROXICODONE Take 1 tablet (5 mg total) by mouth every 4 (four) hours as needed for breakthrough pain.   Pen Needles 32G X 4 MM Misc 1 each by Does not apply route daily.       Follow-up Information    [facility], [facility]. Go on 04/28/2021.   Specialty: General Surgery Why: 10am. Please arrive 30 minutes prior to your appointment for paperwork. Please bring a copy of your photo ID and insurance card.  Contact information: [address] [place] [address] [phone]        [facility] Follow up on 05/21/2021.   Why: Your hospital follow up appointment is scheduled for May 21, 2021 at 2:10pm. You will see Freeman Caldron. Contact information: [place] [address] [phone]             No Known Allergies  Consultations: General surgery  Procedures/Studies: CT Abdomen Pelvis W Contrast  Result Date: 04/07/2021 CLINICAL DATA:  Left upper quadrant abdominal pain EXAM: CT ABDOMEN AND PELVIS WITH CONTRAST TECHNIQUE: Multidetector CT imaging of the abdomen and pelvis was performed using the standard protocol following bolus administration of intravenous contrast. CONTRAST:  172m OMNIPAQUE IOHEXOL 300 MG/ML SOLN, <See Chart> OMNIPAQUE IOHEXOL 300 MG/ML SOLN COMPARISON:  Abdominal ultrasound 04/07/2021 FINDINGS: The patient was apparently unable to raise the right arm during imaging, resulting in mild reduction of signal to noise ratio. Lower chest: Linear subsegmental  atelectasis or scarring in the right middle lobe and posterior basal segment of the left lower lobe. Mild distal esophageal wall thickening, esophagitis would be the most common cause. Hepatobiliary: Multiple gallstones in the gallbladder likely with sludge in the gallbladder as well. No pericholecystic fluid identified. Focal steatosis in segment 4b of the liver adjacent to the falciform ligament. Pancreas: Unremarkable Spleen: Unremarkable Adrenals/Urinary Tract: Unremarkable Stomach/Bowel: Unremarkable.  Normal appendix.  No dilated bowel. Vascular/Lymphatic: Aortoiliac atherosclerotic vascular disease. No pathologic adenopathy. Reproductive: Unremarkable Other: No supplemental non-categorized findings. Musculoskeletal: Unremarkable IMPRESSION: 1. Cholelithiasis. 2. Wall thickening in the distal esophagus, esophagitis would be the most common cause. 3.  Aortic Atherosclerosis (ICD10-I70.0). 4. No specific left upper quadrant finding to fully explain the patient's abdominal pain. Electronically Signed   By: WVan ClinesM.D.   On: 04/07/2021 20:22   UKoreaAbdomen Limited RUQ (LIVER/GB)  Result Date: 04/07/2021 CLINICAL DATA:  Abdominal pain. EXAM: ULTRASOUND ABDOMEN LIMITED RIGHT UPPER QUADRANT COMPARISON:  Ultrasound, 03/08/2021. FINDINGS: Gallbladder: Gallbladder is filled with stones and sludge. No wall thickening or pericholecystic fluid. Positive sonographic Murphy's sign. Common bile duct: Diameter: 4 mm. Liver: No focal lesion identified. Within normal limits in parenchymal echogenicity. Portal vein is patent on color Doppler imaging with normal direction of blood flow towards the liver. Other: None. IMPRESSION:  1. Gallbladder filled with sludge and stones with a positive sonographic Murphy sign, but no wall thickening or pericholecystic fluid. This appearance is stable from the prior study. No convincing acute cholecystitis. 2. Normal liver.  No bile duct dilation. Electronically Signed   By: Lajean Manes M.D.   On: 04/07/2021 16:07     Subjective: No acute issues or events overnight tolerating p.o. well denies nausea vomiting diarrhea constipation headache fevers or chills.   Discharge Exam: Vitals:   04/10/21 0533 04/10/21 1300  BP: (!) 156/82 117/88  Pulse: 81 93  Resp: 17 18  Temp:  98.1 F (36.7 C)  SpO2: 100% 100%   Vitals:   04/09/21 1328 04/09/21 2036 04/10/21 0533 04/10/21 1300  BP: (!) 162/89 (!) 154/90 (!) 156/82 117/88  Pulse: 85 85 81 93  Resp: '16 17 17 18  ' Temp: 98.1 F (36.7 C) 98.9 F (37.2 C)  98.1 F (36.7 C)  TempSrc: Oral Oral  Oral  SpO2: 100% 100% 100% 100%    General: Pt is alert, awake, not in acute distress Cardiovascular: RRR, S1/S2 +, no rubs, no gallops Respiratory: CTA bilaterally, no wheezing, no rhonchi Abdominal: Soft, NT, ND, bowel sounds + Extremities: no edema, no cyanosis    The results of significant diagnostics from this hospitalization (including imaging, microbiology, ancillary and laboratory) are listed below for reference.     Microbiology: Recent Results (from the past 240 hour(s))  Resp Panel by RT-PCR (Flu A&B, Covid) Nasopharyngeal Swab     Status: None   Collection Time: 04/07/21  4:06 PM   Specimen: Nasopharyngeal Swab; Nasopharyngeal(NP) swabs in vial transport medium  Result Value Ref Range Status   SARS Coronavirus 2 by RT PCR NEGATIVE NEGATIVE Final    Comment: (NOTE) SARS-CoV-2 target nucleic acids are NOT DETECTED.  The SARS-CoV-2 RNA is generally detectable in upper respiratory specimens during the acute phase of infection. The lowest concentration of SARS-CoV-2 viral copies this assay can detect is 138 copies/mL. A negative result does not preclude SARS-Cov-2 infection and should not be used as the sole basis for treatment or other patient management decisions. A negative result may occur with  improper specimen collection/handling, submission of specimen other than nasopharyngeal swab, presence of  viral mutation(s) within the areas targeted by this assay, and inadequate number of viral copies(<138 copies/mL). A negative result must be combined with clinical observations, patient history, and epidemiological information. The expected result is Negative.  Fact Sheet for Patients:  EntrepreneurPulse.com.au  Fact Sheet for Healthcare Providers:  IncredibleEmployment.be  This test is no t yet approved or cleared by the [place] FDA and  has been authorized for detection and/or diagnosis of SARS-CoV-2 by FDA under an Emergency Use Authorization (EUA). This EUA will remain  in effect (meaning this test can be used) for the duration of the COVID-19 declaration under Section 564(b)(1) of the Act, 21 U.S.C.section 360bbb-3(b)(1), unless the authorization is terminated  or revoked sooner.       Influenza A by PCR NEGATIVE NEGATIVE Final   Influenza B by PCR NEGATIVE NEGATIVE Final    Comment: (NOTE) The Xpert Xpress SARS-CoV-2/FLU/RSV plus assay is intended as an aid in the diagnosis of influenza from Nasopharyngeal swab specimens and should not be used as a sole basis for treatment. Nasal washings and aspirates are unacceptable for Xpert Xpress SARS-CoV-2/FLU/RSV testing.  Fact Sheet for Patients: EntrepreneurPulse.com.au  Fact Sheet for Healthcare Providers: IncredibleEmployment.be  This test is not yet approved or cleared by  the [employer] and has been authorized for detection and/or diagnosis of SARS-CoV-2 by FDA under an Emergency Use Authorization (EUA). This EUA will remain in effect (meaning this test can be used) for the duration of the COVID-19 declaration under Section 564(b)(1) of the Act, 21 U.S.C. section 360bbb-3(b)(1), unless the authorization is terminated or revoked.  Performed at [facility], [place] [address] [place], [place] [address]   Surgical pcr  screen     Status: Abnormal   Collection Time: 04/08/21  5:15 AM   Specimen: Nasal Mucosa; Nasal Swab  Result Value Ref Range Status   MRSA, PCR NEGATIVE NEGATIVE Final   Staphylococcus aureus POSITIVE (A) NEGATIVE Final    Comment: (NOTE) The Xpert SA Assay (FDA approved for NASAL specimens in patients 99 years of age and older), is one component of a comprehensive surveillance program. It is not intended to diagnose infection nor to guide or monitor treatment. Performed at [facility], [place] [address] [place], [place] [address]      Labs: BNP (last 3 results) No results for input(s): BNP in the last 8760 hours. Basic Metabolic Panel: Recent Labs  Lab 04/07/21 1501 04/08/21 0401 04/09/21 0421 04/10/21 0342  NA 136 142 137 134*  K 3.4* 4.6 4.6 3.9  CL 100 107 102 99  CO2 22 21* 24 22  GLUCOSE 377* 231* 225* 165*  BUN '10 10 10 8  ' CREATININE 0.67 0.50 0.54 0.61  CALCIUM 9.5 9.8 9.3 8.8*  MG 1.9 2.0  --   --    Liver Function Tests: Recent Labs  Lab 04/07/21 1501 04/08/21 0401  AST 33 27  ALT 36 34  ALKPHOS 80 79  BILITOT 0.9 1.0  PROT 8.3* 7.8  ALBUMIN 4.1 4.2   Recent Labs  Lab 04/07/21 1501 04/08/21 0401  LIPASE 32 26   No results for input(s): AMMONIA in the last 168 hours. CBC: Recent Labs  Lab 04/07/21 1501 04/08/21 0401 04/09/21 0421 04/10/21 0342  WBC 8.8 14.7* 12.7* 13.6*  NEUTROABS 5.4 11.3*  --   --   HGB 15.6* 14.6 14.7 13.4  HCT 45.3 42.6 41.8 39.1  MCV 91.0 90.1 88.9 90.5  PLT 261 272 207 236   Cardiac Enzymes: No results for input(s): CKTOTAL, CKMB, CKMBINDEX, TROPONINI in the last 168 hours. BNP: Invalid input(s): POCBNP CBG: Recent Labs  Lab 04/09/21 1107 04/09/21 1759 04/09/21 2323 04/10/21 0547 04/10/21 1154  GLUCAP 163* 288* 224* 168* 179*   D-Dimer No results for input(s): DDIMER in the last 72 hours. Hgb A1c No results for input(s): HGBA1C in the last 72 hours. Lipid Profile No results for  input(s): CHOL, HDL, LDLCALC, TRIG, CHOLHDL, LDLDIRECT in the last 72 hours. Thyroid function studies No results for input(s): TSH, T4TOTAL, T3FREE, THYROIDAB in the last 72 hours.  Invalid input(s): FREET3 Anemia work up No results for input(s): VITAMINB12, FOLATE, FERRITIN, TIBC, IRON, RETICCTPCT in the last 72 hours. Urinalysis    Component Value Date/Time   COLORURINE YELLOW 04/07/2021 1606   APPEARANCEUR CLEAR 04/07/2021 1606   LABSPEC 1.040 (H) 04/07/2021 1606   PHURINE 6.0 04/07/2021 1606   GLUCOSEU >=500 (A) 04/07/2021 1606   HGBUR NEGATIVE 04/07/2021 1606   BILIRUBINUR NEGATIVE 04/07/2021 1606   KETONESUR 80 (A) 04/07/2021 1606   PROTEINUR 30 (A) 04/07/2021 1606   UROBILINOGEN 1.0 07/01/2009 0722   NITRITE NEGATIVE 04/07/2021 1606   LEUKOCYTESUR SMALL (A) 04/07/2021 1606   Sepsis Labs Invalid input(s): PROCALCITONIN,  WBC,  LACTICIDVEN Microbiology Recent Results (from the past 240 hour(s))  Resp Panel by RT-PCR (Flu A&B, Covid) Nasopharyngeal Swab     Status: None   Collection Time: 04/07/21  4:06 PM   Specimen: Nasopharyngeal Swab; Nasopharyngeal(NP) swabs in vial transport medium  Result Value Ref Range Status   SARS Coronavirus 2 by RT PCR NEGATIVE NEGATIVE Final    Comment: (NOTE) SARS-CoV-2 target nucleic acids are NOT DETECTED.  The SARS-CoV-2 RNA is generally detectable in upper respiratory specimens during the acute phase of infection. The lowest concentration of SARS-CoV-2 viral copies this assay can detect is 138 copies/mL. A negative result does not preclude SARS-Cov-2 infection and should not be used as the sole basis for treatment or other patient management decisions. A negative result may occur with  improper specimen collection/handling, submission of specimen other than nasopharyngeal swab, presence of viral mutation(s) within the areas targeted by this assay, and inadequate number of viral copies(<138 copies/mL). A negative result must be  combined with clinical observations, patient history, and epidemiological information. The expected result is Negative.  Fact Sheet for Patients:  EntrepreneurPulse.com.au  Fact Sheet for Healthcare Providers:  IncredibleEmployment.be  This test is no t yet approved or cleared by the [place] FDA and  has been authorized for detection and/or diagnosis of SARS-CoV-2 by FDA under an Emergency Use Authorization (EUA). This EUA will remain  in effect (meaning this test can be used) for the duration of the COVID-19 declaration under Section 564(b)(1) of the Act, 21 U.S.C.section 360bbb-3(b)(1), unless the authorization is terminated  or revoked sooner.       Influenza A by PCR NEGATIVE NEGATIVE Final   Influenza B by PCR NEGATIVE NEGATIVE Final    Comment: (NOTE) The Xpert Xpress SARS-CoV-2/FLU/RSV plus assay is intended as an aid in the diagnosis of influenza from Nasopharyngeal swab specimens and should not be used as a sole basis for treatment. Nasal washings and aspirates are unacceptable for Xpert Xpress SARS-CoV-2/FLU/RSV testing.  Fact Sheet for Patients: EntrepreneurPulse.com.au  Fact Sheet for Healthcare Providers: IncredibleEmployment.be  This test is not yet approved or cleared by the [place] FDA and has been authorized for detection and/or diagnosis of SARS-CoV-2 by FDA under an Emergency Use Authorization (EUA). This EUA will remain in effect (meaning this test can be used) for the duration of the COVID-19 declaration under Section 564(b)(1) of the Act, 21 U.S.C. section 360bbb-3(b)(1), unless the authorization is terminated or revoked.  Performed at [facility], [place] [address] [place], [place] [address]   Surgical pcr screen     Status: Abnormal   Collection Time: 04/08/21  5:15 AM   Specimen: Nasal Mucosa; Nasal Swab  Result Value Ref Range Status   MRSA,  PCR NEGATIVE NEGATIVE Final   Staphylococcus aureus POSITIVE (A) NEGATIVE Final    Comment: (NOTE) The Xpert SA Assay (FDA approved for NASAL specimens in patients 93 years of age and older), is one component of a comprehensive surveillance program. It is not intended to diagnose infection nor to guide or monitor treatment. Performed at [facility], [place] [address], [place], [place] [address]      Time coordinating discharge: Over 30 minutes  SIGNED:   Little Ishikawa, DO [facility] 04/10/2021, 4:25 PM Pager   If 7PM-7AM, please contact night-coverage [email or web address]

## 2021-04-20 ENCOUNTER — Encounter (HOSPITAL_COMMUNITY): Payer: Self-pay | Admitting: *Deleted

## 2021-04-20 ENCOUNTER — Emergency Department (HOSPITAL_COMMUNITY): Payer: Medicaid Other

## 2021-04-20 ENCOUNTER — Observation Stay (HOSPITAL_COMMUNITY)
Admission: EM | Admit: 2021-04-20 | Discharge: 2021-04-21 | Disposition: A | Discharge status: Home / Self Care | Payer: Medicaid Other | Attending: Family Medicine | Admitting: Family Medicine

## 2021-04-20 DIAGNOSIS — K529 Noninfective gastroenteritis and colitis, unspecified: Secondary | ICD-10-CM | POA: Insufficient documentation

## 2021-04-20 DIAGNOSIS — K5900 Constipation, unspecified: Principal | ICD-10-CM | POA: Insufficient documentation

## 2021-04-20 DIAGNOSIS — E1165 Type 2 diabetes mellitus with hyperglycemia: Secondary | ICD-10-CM

## 2021-04-20 DIAGNOSIS — F17200 Nicotine dependence, unspecified, uncomplicated: Secondary | ICD-10-CM | POA: Insufficient documentation

## 2021-04-20 DIAGNOSIS — E119 Type 2 diabetes mellitus without complications: Secondary | ICD-10-CM | POA: Diagnosis not present

## 2021-04-20 DIAGNOSIS — IMO0002 Reserved for concepts with insufficient information to code with codable children: Secondary | ICD-10-CM

## 2021-04-20 DIAGNOSIS — R739 Hyperglycemia, unspecified: Secondary | ICD-10-CM | POA: Diagnosis present

## 2021-04-20 DIAGNOSIS — Z20822 Contact with and (suspected) exposure to covid-19: Secondary | ICD-10-CM | POA: Diagnosis not present

## 2021-04-20 DIAGNOSIS — R1084 Generalized abdominal pain: Secondary | ICD-10-CM

## 2021-04-20 DIAGNOSIS — R109 Unspecified abdominal pain: Secondary | ICD-10-CM | POA: Diagnosis present

## 2021-04-20 DIAGNOSIS — K810 Acute cholecystitis: Secondary | ICD-10-CM | POA: Diagnosis present

## 2021-04-20 DIAGNOSIS — R112 Nausea with vomiting, unspecified: Secondary | ICD-10-CM | POA: Diagnosis present

## 2021-04-20 DIAGNOSIS — K219 Gastro-esophageal reflux disease without esophagitis: Secondary | ICD-10-CM | POA: Diagnosis present

## 2021-04-20 DIAGNOSIS — Z720 Tobacco use: Secondary | ICD-10-CM | POA: Diagnosis present

## 2021-04-20 DIAGNOSIS — Z794 Long term (current) use of insulin: Secondary | ICD-10-CM | POA: Insufficient documentation

## 2021-04-20 HISTORY — DX: Type 2 diabetes mellitus without complications: E11.9

## 2021-04-20 LAB — COMPREHENSIVE METABOLIC PANEL
ALT: 35 U/L (ref 0–44)
AST: 33 U/L (ref 15–41)
Albumin: 3.8 g/dL (ref 3.5–5.0)
Alkaline Phosphatase: 66 U/L (ref 38–126)
Anion gap: 9 (ref 5–15)
BUN: 10 mg/dL (ref 6–20)
CO2: 30 mmol/L (ref 22–32)
Calcium: 9.3 mg/dL (ref 8.9–10.3)
Chloride: 95 mmol/L — ABNORMAL LOW (ref 98–111)
Creatinine, Ser: 0.63 mg/dL (ref 0.44–1.00)
GFR, Estimated: >60 mL/min (ref >60)
Glucose, Bld: 232 mg/dL — ABNORMAL HIGH (ref 70–99)
Potassium: 3.2 mmol/L — ABNORMAL LOW (ref 3.5–5.1)
Sodium: 134 mmol/L — ABNORMAL LOW (ref 135–145)
Total Bilirubin: 0.9 mg/dL (ref 0.3–1.2)
Total Protein: 7.5 g/dL (ref 6.5–8.1)

## 2021-04-20 LAB — RESP PANEL BY RT-PCR (FLU A&B, COVID) ARPGX2
Influenza A by PCR: NEGATIVE
Influenza B by PCR: NEGATIVE
SARS Coronavirus 2 by RT PCR: NEGATIVE

## 2021-04-20 LAB — CBC
HCT: 41.5 % (ref 36.0–46.0)
Hemoglobin: 14.2 g/dL (ref 12.0–15.0)
MCH: 31.3 pg (ref 26.0–34.0)
MCHC: 34.2 g/dL (ref 30.0–36.0)
MCV: 91.4 fL (ref 80.0–100.0)
Platelets: 341 10*3/uL (ref 150–400)
RBC: 4.54 MIL/uL (ref 3.87–5.11)
RDW: 11.8 % (ref 11.5–15.5)
WBC: 12.3 10*3/uL — ABNORMAL HIGH (ref 4.0–10.5)
nRBC: 0 % (ref 0.0–0.2)

## 2021-04-20 LAB — URINALYSIS, ROUTINE W REFLEX MICROSCOPIC
Bacteria, UA: NONE SEEN
Bilirubin Urine: NEGATIVE
Glucose, UA: >=500 mg/dL — AB
Hgb urine dipstick: NEGATIVE
Ketones, ur: 80 mg/dL — AB
Nitrite: NEGATIVE
Protein, ur: 100 mg/dL — AB
Specific Gravity, Urine: 1.021 (ref 1.005–1.030)
pH: 6 (ref 5.0–8.0)

## 2021-04-20 LAB — LIPASE, BLOOD: Lipase: 28 U/L (ref 11–51)

## 2021-04-20 LAB — CBG MONITORING, ED: Glucose-Capillary: 187 mg/dL — ABNORMAL HIGH (ref 70–99)

## 2021-04-20 MED ORDER — OXYCODONE-ACETAMINOPHEN 5-325 MG PO TABS
1.0000 | ORAL_TABLET | Freq: Once | ORAL | Status: AC
  Administered 2021-04-20: 1 via ORAL
  Filled 2021-04-20: qty 1

## 2021-04-20 MED ORDER — METRONIDAZOLE 500 MG/100ML IV SOLN
500.0000 mg | Freq: Once | INTRAVENOUS | Status: AC
  Administered 2021-04-21: 500 mg via INTRAVENOUS
  Filled 2021-04-20: qty 100

## 2021-04-20 MED ORDER — SODIUM CHLORIDE 0.9 % IV BOLUS
1000.0000 mL | Freq: Once | INTRAVENOUS | Status: AC
  Administered 2021-04-20: 1000 mL via INTRAVENOUS

## 2021-04-20 MED ORDER — CIPROFLOXACIN IN D5W 400 MG/200ML IV SOLN
400.0000 mg | Freq: Two times a day (BID) | INTRAVENOUS | Status: DC
  Administered 2021-04-21: 400 mg via INTRAVENOUS
  Filled 2021-04-20: qty 200

## 2021-04-20 MED ORDER — POTASSIUM CHLORIDE CRYS ER 20 MEQ PO TBCR
40.0000 meq | EXTENDED_RELEASE_TABLET | Freq: Once | ORAL | Status: AC
  Administered 2021-04-20: 40 meq via ORAL
  Filled 2021-04-20: qty 2

## 2021-04-20 MED ORDER — IOHEXOL 9 MG/ML PO SOLN
500.0000 mL | ORAL | Status: AC
  Administered 2021-04-20 (×2): 500 mL via ORAL

## 2021-04-20 MED ORDER — IOHEXOL 9 MG/ML PO SOLN
ORAL | Status: AC
  Administered 2021-04-20: 1 mL
  Filled 2021-04-20: qty 1000

## 2021-04-20 MED ORDER — INSULIN ASPART 100 UNIT/ML IJ SOLN
0.0000 [IU] | INTRAMUSCULAR | Status: DC | PRN
  Administered 2021-04-21: 3 [IU] via SUBCUTANEOUS
  Filled 2021-04-20: qty 0.09

## 2021-04-20 MED ORDER — MORPHINE SULFATE (PF) 2 MG/ML IV SOLN: 1.0000 mg | INTRAVENOUS | Status: DC | PRN

## 2021-04-20 MED ORDER — ONDANSETRON 4 MG PO TBDP
4.0000 mg | ORAL_TABLET | Freq: Once | ORAL | Status: AC
  Administered 2021-04-20: 4 mg via ORAL
  Filled 2021-04-20: qty 1

## 2021-04-20 MED ORDER — ONDANSETRON HCL 4 MG/2ML IJ SOLN
4.0000 mg | Freq: Once | INTRAMUSCULAR | Status: AC
  Administered 2021-04-20: 4 mg via INTRAVENOUS
  Filled 2021-04-20: qty 2

## 2021-04-20 MED ORDER — SODIUM CHLORIDE 0.9 % IV SOLN
12.5000 mg | Freq: Four times a day (QID) | INTRAVENOUS | Status: DC | PRN
  Filled 2021-04-20: qty 0.5

## 2021-04-20 MED ORDER — METRONIDAZOLE 500 MG/100ML IV SOLN
500.0000 mg | Freq: Three times a day (TID) | INTRAVENOUS | Status: DC
  Administered 2021-04-21: 500 mg via INTRAVENOUS
  Filled 2021-04-20: qty 100

## 2021-04-20 MED ORDER — CIPROFLOXACIN IN D5W 400 MG/200ML IV SOLN
400.0000 mg | Freq: Once | INTRAVENOUS | Status: AC
  Administered 2021-04-20: 400 mg via INTRAVENOUS
  Filled 2021-04-20: qty 200

## 2021-04-20 MED ORDER — ONDANSETRON 4 MG PO TBDP: 4.0000 mg | ORAL_TABLET | Freq: Three times a day (TID) | ORAL | 0 refills | Status: DC | PRN

## 2021-04-20 MED ORDER — MORPHINE SULFATE (PF) 4 MG/ML IV SOLN
4.0000 mg | Freq: Once | INTRAVENOUS | Status: AC
  Administered 2021-04-20: 4 mg via INTRAVENOUS
  Filled 2021-04-20: qty 1

## 2021-04-20 MED ORDER — SODIUM CHLORIDE 0.9 % IV SOLN
INTRAVENOUS | Status: DC
  Administered 2021-04-21: 100 mL/h via INTRAVENOUS

## 2021-04-20 NOTE — Progress Notes (Signed)
Pharmacy Antibiotic Note  [name] is a 45 y.o. female admitted on 04/20/2021 with recent cholecystectomy presents with vomiting and abdominal pain over the past 3 days.  CT scan  demonstrated colitis and rectus hematoma..  Pharmacy has been consulted to dose cipro for intra-abdominal infection.  Plan: cipro 400mg  IV q12h Flagyl 500mg  IV q8h per MD Follow renal function and clinical course     Temp (24hrs), Avg:99.5 F (37.5 C), Min:98.8 F (37.1 C), Max:99.8 F (37.7 C)  Recent Labs  Lab 04/20/21 1111  WBC 12.3*  CREATININE 0.63    CrCl cannot be calculated (Unknown ideal weight.).    No Known Allergies   Thank you for allowing pharmacy to be a part of this patient's care.  RPh 04/20/2021, 11:47 PM

## 2021-04-20 NOTE — ED Provider Notes (Signed)
7:18 PM signout from [place] PA-C at shift change.  Patient with recent cholecystectomy presents with vomiting and abdominal pain over the past 3 days.  Pending CT scan with oral contrast.  This demonstrated colitis and rectus hematoma.  Overall patient is feeling a bit better now.  She still has a bit of pain but is no longer nauseous.  She was able to tolerate oral contrast without vomiting.  She will likely need treated for colitis and pain and nausea control.  We discussed outpatient treatment with surgery as planned on the 18th, versus admission to the hospital for symptom control.  We will give a dose of Zofran, oral pain medication, fluid challenge and reassess.  I helped the patient up to the restroom.  She was in no distress and did not have any difficulty with ambulation.  Recheck temperature as well.  BP 124/84   Pulse 72   Temp 99.8 F (37.7 C)   Resp 18   LMP  (LMP Unknown) Comment: neg test  SpO2 99%   10:26 PM patient continued to have significant nausea after administration of oral medications.  Her pain is returning.  At this point she does not feel comfortable with discharged home due to uncontrolled symptoms.  Patient will be started on IV antibiotics for suspected colitis.  She will be given additional fluids and antiemetics.  I spoke with Dr. Toniann Fail of [facility] who will evaluate the patient.  BP 119/83 (BP Location: Right Arm)   Pulse 88   Temp 99.8 F (37.7 C) (Oral)   Resp 16   LMP  (LMP Unknown) Comment: neg test  SpO2 100%     Renne Crigler, PA-C 04/20/21 2227    Benjiman Core, MD 04/20/21 2350

## 2021-04-20 NOTE — H&P (Signed)
History and Physical    [name] JOI:[id number] DOB: 27-Feb-1976 DOA: 04/20/2021  PCP: Patient, No Pcp Per (Inactive)  Patient coming from: Home.  Chief Complaint: Abdominal pain.  HPI: [name] is a 45 y.o. female with recent admission for cholecystitis status postcholecystectomy discharged on May 6 about 11 days ago presents to the ER with 3 days of nausea vomiting and abdominal discomfort with abdominal discomfort and mostly on the left side constantly no diarrhea but has constipation.  Denies fever chills.  During recent admission patient also was diagnosed with new onset diabetes mellitus.  ED Course: In the ER on exam patient has left lower quadrant tenderness.  Patient was afebrile.  CT abdomen pelvis shows colitis involving the colon from previous cecal valve to mid descending colon and there is also rectus sheath hematoma.  Labs show sodium normal 134 potassium 3.2 WBC 12.3 LFTs are normal.  COVID test negative.  Patient started on empiric antibiotics and admitted for abdominal pain with colitis.  Patient still having nausea vomiting.  Review of Systems: As per HPI, rest all negative.   Past Medical History:  Diagnosis Date  . Cholelithiasis   . Diabetes mellitus type 2 in nonobese ([place]) 04/20/2021  . GERD (gastroesophageal reflux disease)   . Tobacco abuse     Past Surgical History:  Procedure Laterality Date  . CESAREAN SECTION    . CHOLECYSTECTOMY N/A 04/08/2021   Procedure: LAPAROSCOPIC CHOLECYSTECTOMy;  Surgeon: Coralie Keens, MD;  Location: [facility];  Service: General;  Laterality: N/A;     reports that she has been smoking. She has been smoking about 1.50 packs per day. She has never used smokeless tobacco. She reports current alcohol use. She reports current drug use. Drug: Marijuana.  No Known Allergies  Family History  Problem Relation Age of Onset  . Hypertension Mother     Prior to Admission medications   Medication Sig Start Date End Date  Taking? Authorizing Provider  famotidine (PEPCID) 20 MG tablet Take 1 tablet (20 mg total) by mouth 2 (two) times daily. 01/09/20  Yes Jaynee Eagles, PA-C  insulin isophane & regular human (NOVOLIN 70/30 FLEXPEN RELION) (70-30) 100 UNIT/ML KwikPen Inject 8 Units into the skin 2 (two) times daily. 04/10/21  Yes Little Ishikawa, MD  ondansetron (ZOFRAN ODT) 4 MG disintegrating tablet Take 1 tablet (4 mg total) by mouth every 8 (eight) hours as needed for nausea or vomiting. 04/20/21  Yes Aberman, Caroline C, PA-C  ondansetron (ZOFRAN) 4 MG tablet Take 1 tablet (4 mg total) by mouth daily as needed for nausea or vomiting. 04/10/21 04/10/22 Yes Little Ishikawa, MD  blood glucose meter kit and supplies KIT Dispense based on patient and insurance preference. Use up to four times daily as directed. 04/10/21   Little Ishikawa, MD  Insulin Pen Needle (PEN NEEDLES) 32G X 4 MM MISC 1 each by Does not apply route daily. 04/10/21 05/10/21  Little Ishikawa, MD  oxyCODONE (OXY IR/ROXICODONE) 5 MG immediate release tablet Take 1 tablet (5 mg total) by mouth every 4 (four) hours as needed for breakthrough pain. 04/09/21   Maczis, Barth Kirks, PA-C    Physical Exam: Constitutional: Moderately built and nourished. Vitals:   04/20/21 1937 04/20/21 2000 04/20/21 2100 04/20/21 2204  BP: (!) 155/86 (!) 167/98 117/82 119/83  Pulse: 73 75 72 88  Resp: '20 18 18 16  ' Temp: 99.8 F (37.7 C)     TempSrc: Oral  SpO2: 100% 100% 100% 100%   Eyes: Anicteric no pallor. ENMT: No discharge from the ears eyes nose and mouth. Neck: No mass felt.  No neck rigidity. Respiratory: No rhonchi or crepitations. Cardiovascular: S1-S2 heard. Abdomen: Tenderness in left lower quadrant.  No guarding or rigidity. Musculoskeletal: No edema. Skin: No rash. Neurologic: Alert awake oriented to time place and person.  Moves all extremities. Psychiatric: Appears normal.  Normal affect.   Labs on Admission: I have personally reviewed  following labs and imaging studies  CBC: Recent Labs  Lab 04/20/21 1111  WBC 12.3*  HGB 14.2  HCT 41.5  MCV 91.4  PLT 540   Basic Metabolic Panel: Recent Labs  Lab 04/20/21 1111  NA 134*  K 3.2*  CL 95*  CO2 30  GLUCOSE 232*  BUN 10  CREATININE 0.63  CALCIUM 9.3   GFR: CrCl cannot be calculated (Unknown ideal weight.). Liver Function Tests: Recent Labs  Lab 04/20/21 1111  AST 33  ALT 35  ALKPHOS 66  BILITOT 0.9  PROT 7.5  ALBUMIN 3.8   Recent Labs  Lab 04/20/21 1111  LIPASE 28   No results for input(s): AMMONIA in the last 168 hours. Coagulation Profile: No results for input(s): INR, PROTIME in the last 168 hours. Cardiac Enzymes: No results for input(s): CKTOTAL, CKMB, CKMBINDEX, TROPONINI in the last 168 hours. BNP (last 3 results) No results for input(s): PROBNP in the last 8760 hours. HbA1C: No results for input(s): HGBA1C in the last 72 hours. CBG: Recent Labs  Lab 04/20/21 1945  GLUCAP 187*   Lipid Profile: No results for input(s): CHOL, HDL, LDLCALC, TRIG, CHOLHDL, LDLDIRECT in the last 72 hours. Thyroid Function Tests: No results for input(s): TSH, T4TOTAL, FREET4, T3FREE, THYROIDAB in the last 72 hours. Anemia Panel: No results for input(s): VITAMINB12, FOLATE, FERRITIN, TIBC, IRON, RETICCTPCT in the last 72 hours. Urine analysis:    Component Value Date/Time   COLORURINE AMBER (A) 04/20/2021 1405   APPEARANCEUR HAZY (A) 04/20/2021 1405   LABSPEC 1.021 04/20/2021 1405   PHURINE 6.0 04/20/2021 1405   GLUCOSEU >=500 (A) 04/20/2021 1405   HGBUR NEGATIVE 04/20/2021 [place] 04/20/2021 1405   KETONESUR 80 (A) 04/20/2021 1405   PROTEINUR 100 (A) 04/20/2021 1405   UROBILINOGEN 1.0 07/01/2009 0722   NITRITE NEGATIVE 04/20/2021 1405   LEUKOCYTESUR TRACE (A) 04/20/2021 1405   Sepsis Labs: '@LABRCNTIP' (procalcitonin:4,lacticidven:4) ) Recent Results (from the past 240 hour(s))  Resp Panel by RT-PCR (Flu A&B, Covid)  Nasopharyngeal Swab     Status: None   Collection Time: 04/20/21 10:15 PM   Specimen: Nasopharyngeal Swab; Nasopharyngeal(NP) swabs in vial transport medium  Result Value Ref Range Status   SARS Coronavirus 2 by RT PCR NEGATIVE NEGATIVE Final    Comment: (NOTE) SARS-CoV-2 target nucleic acids are NOT DETECTED.  The SARS-CoV-2 RNA is generally detectable in upper respiratory specimens during the acute phase of infection. The lowest concentration of SARS-CoV-2 viral copies this assay can detect is 138 copies/mL. A negative result does not preclude SARS-Cov-2 infection and should not be used as the sole basis for treatment or other patient management decisions. A negative result may occur with  improper specimen collection/handling, submission of specimen other than nasopharyngeal swab, presence of viral mutation(s) within the areas targeted by this assay, and inadequate number of viral copies(<138 copies/mL). A negative result must be combined with clinical observations, patient history, and epidemiological information. The expected result is Negative.  Fact Sheet for Patients:  EntrepreneurPulse.com.au  Fact Sheet for Healthcare Providers:  IncredibleEmployment.be  This test is no t yet approved or cleared by the [place] FDA and  has been authorized for detection and/or diagnosis of SARS-CoV-2 by FDA under an Emergency Use Authorization (EUA). This EUA will remain  in effect (meaning this test can be used) for the duration of the COVID-19 declaration under Section 564(b)(1) of the Act, 21 U.S.C.section 360bbb-3(b)(1), unless the authorization is terminated  or revoked sooner.       Influenza A by PCR NEGATIVE NEGATIVE Final   Influenza B by PCR NEGATIVE NEGATIVE Final    Comment: (NOTE) The Xpert Xpress SARS-CoV-2/FLU/RSV plus assay is intended as an aid in the diagnosis of influenza from Nasopharyngeal swab specimens and should not be  used as a sole basis for treatment. Nasal washings and aspirates are unacceptable for Xpert Xpress SARS-CoV-2/FLU/RSV testing.  Fact Sheet for Patients: EntrepreneurPulse.com.au  Fact Sheet for Healthcare Providers: IncredibleEmployment.be  This test is not yet approved or cleared by the [place] FDA and has been authorized for detection and/or diagnosis of SARS-CoV-2 by FDA under an Emergency Use Authorization (EUA). This EUA will remain in effect (meaning this test can be used) for the duration of the COVID-19 declaration under Section 564(b)(1) of the Act, 21 U.S.C. section 360bbb-3(b)(1), unless the authorization is terminated or revoked.  Performed at [facility], [place] [address] [place], [place] [address]      Radiological Exams on Admission: CT ABDOMEN PELVIS WO CONTRAST  Result Date: 04/20/2021 CLINICAL DATA:  Left lower quadrant abdominal pain. EXAM: CT ABDOMEN AND PELVIS WITHOUT CONTRAST TECHNIQUE: Multidetector CT imaging of the abdomen and pelvis was performed following the standard protocol without IV contrast. COMPARISON:  04/07/2021 FINDINGS: Lower chest: No acute abnormality. Hepatobiliary: Status post cholecystectomy. There is a small volume fluid within the gallbladder fossa adjacent to cholecystectomy clips, nonspecific and likely postoperative. No signs of bile duct dilatation. No focal liver abnormality. Pancreas: Unremarkable. No pancreatic ductal dilatation or surrounding inflammatory changes. Spleen: Normal in size without focal abnormality. Adrenals/Urinary Tract: Normal adrenal glands. No kidney stone or hydronephrosis. Bladder is unremarkable. Stomach/Bowel: Stomach is nondistended. The appendix is visualized and appears normal. No small bowel wall thickening, inflammation or distension. There is circumferential wall thickening involving the: From the level of the ileocecal valve to the mid descending  colon with pericolonic soft tissue stranding compatible with colitis. No signs of bowel obstruction, pneumatosis or bowel perforation. Vascular/Lymphatic: Aortic atherosclerosis. No enlarged abdominal or pelvic lymph nodes. Reproductive: Uterus and bilateral adnexa are unremarkable. Other: There is a trace amount of free fluid within the dependent portion of the gallbladder. Within the limitations of unenhanced technique there is no focal loculated fluid collections to suggest drainable abscess. Musculoskeletal: There is abnormal asymmetric thickening of the left rectus abdominus musculature which measures 5.2 x 3.2 by 14.6 cm (volume = 130 cm^3). This is compatible with rectus sheath hematoma, image 61/2. IMPRESSION: 1. Examination is positive for circumferential wall thickening involving the colon from the level of the ileocecal valve to the mid descending colon compatible with colitis. No signs of bowel obstruction, pneumatosis or bowel perforation. 2. Interval development of left rectus sheath hematoma with a volume of approximately 130 cc. 3. Status post cholecystectomy. Small volume fluid within the gallbladder fossa is nonspecific and compatible with postoperative change. 4. Aortic atherosclerosis. Aortic Atherosclerosis (ICD10-I70.0). Electronically Signed   By: Kerby Moors M.D.   On: 04/20/2021 18:38  Assessment/Plan Principal Problem:   Colitis Active Problems:   Abdominal pain   Diabetes mellitus type 2 in nonobese ([place])    1. Left lower quadrant abdominal pain with CT scan showing colitis involving the colon from ileocecal valve to the mid descending colon with left-sided rectus sheath hematoma.  On antibiotics IV fluids n.p.o. pain medications.  Given the patient had recently cholecystectomy consult general surgery in the morning. 2. Diabetes mellitus type 2 with hyperglycemia recently diagnosed.  We will keep patient on sliding scale coverage and Lantus.  Patient is n.p.o. closely  follow CBGs.  Urine does show some ketones likely from nausea vomiting.   DVT prophylaxis: SCDs.  Patient has a rectus sheath hematoma so avoiding anticoagulation. Code Status: Full code. Family Communication: Discussed with patient. Disposition Plan: Home. Consults called: None. Admission status: Observation.   Rise Patience MD [facility] Pager [phone].  If 7PM-7AM, please contact night-coverage [email or web address] Password [facility]  04/20/2021, 11:28 PM

## 2021-04-20 NOTE — ED Triage Notes (Signed)
Pt complains of left sided abdominal, vomiting, and has not had BM x 3 days. She has gallbladder removed on 5/3.

## 2021-04-20 NOTE — ED Provider Notes (Signed)
Emergency Medicine Provider Triage Evaluation Note  [name] , a 45 y.o. female  was evaluated in triage.  Pt complains of left-sided abdominal pain that radiates to left low back.  Associated with numerous episodes of nonbloody, nonbilious emesis.  Last bowel movement 3 days ago.  Denies urinary and vaginal symptoms. No history of kidney stones. No rash. Patient had her gallbladder removed on 5/3 and is not taking opioid medications for the past few days.  Abdominal pain associated with shortness of breath.  Denies chest pain.  Review of Systems  Positive: Abdominal pain Negative: fever  Physical Exam  BP (!) 136/98 (BP Location: Right Arm)   Pulse (!) 104   Temp 98.8 F (37.1 C) (Oral)   Resp 20   LMP  (LMP Unknown) Comment: neg test  SpO2 100%  Gen:   Awake, no distress  Resp:  Normal effort  MSK:   Moves extremities without difficulty Other:    Medical Decision Making  Medically screening exam initiated at 11:52 AM.  Appropriate orders placed.  [name] was informed that the remainder of the evaluation will be completed by another provider, this initial triage assessment does not replace that evaluation, and the importance of remaining in the ED until their evaluation is complete.  Left-sided abdominal pain. Labs ordered.    Mannie Stabile, PA-C 04/20/21 1155    Bethann Berkshire, MD 04/22/21 1002

## 2021-04-20 NOTE — ED Provider Notes (Signed)
[facility]-EMERGENCY DEPT Provider Note   CSN: [id number] Arrival date & time: 04/20/21  1053     History Chief Complaint  Patient presents with  . Abdominal Pain    [name] is a 45 y.o. female with a past medical history significant for GERD, marijuana abuse, tobacco abuse who presents to the ED due to left-sided abdominal pain that radiates to low back x3 days associated with numerous episodes of non-bloody, non-bilious emesis. Patient notes she is unable to tolerate po for the past 3 days.  Last bowel movement 3 days ago.  Denies urinary and vaginal symptoms.  No history of kidney stones.  No overlying rash.  Chart reviewed.  Patient had a laparoscopic cholecystectomy on 04/08/2021 by Dr. Blackman with general surgery. Admits to frequent marijuana use. No fever or chills. She has tried Pepcid and Zofran with no relief. No aggravating or alleviating factors.   History obtained from patient and past medical records. No interpreter used during encounter.      Past Medical History:  Diagnosis Date  . Cholelithiasis   . GERD (gastroesophageal reflux disease)   . Tobacco abuse     Patient Active Problem List   Diagnosis Date Noted  . Abdominal pain 04/08/2021  . Hypokalemia 04/08/2021  . Hyperglycemia 04/08/2021  . Dehydration 04/08/2021  . GERD (gastroesophageal reflux disease)   . Tobacco abuse   . Nausea & vomiting   . Cholecystitis 04/07/2021    Past Surgical History:  Procedure Laterality Date  . CESAREAN SECTION    . CHOLECYSTECTOMY N/A 04/08/2021   Procedure: LAPAROSCOPIC CHOLECYSTECTOMy;  Surgeon: Blackman, Douglas, MD;  Location: [facility];  Service: General;  Laterality: N/A;     OB History   No obstetric history on file.     Family History  Problem Relation Age of Onset  . Hypertension Mother     Social History   Tobacco Use  . Smoking status: Current Every Day Smoker    Packs/day: 1.50  . Smokeless tobacco: Never Used   Vaping Use  . Vaping Use: Never used  Substance Use Topics  . Alcohol use: Yes    Comment: socially  . Drug use: Yes    Types: Marijuana    Home Medications Prior to Admission medications   Medication Sig Start Date End Date Taking? Authorizing Provider  ondansetron (ZOFRAN ODT) 4 MG disintegrating tablet Take 1 tablet (4 mg total) by mouth every 8 (eight) hours as needed for nausea or vomiting. 04/20/21  Yes Aberman, Caroline C, PA-C  blood glucose meter kit and supplies KIT Dispense based on patient and insurance preference. Use up to four times daily as directed. 04/10/21   [place], William C, MD  famotidine (PEPCID) 20 MG tablet Take 1 tablet (20 mg total) by mouth 2 (two) times daily. 01/09/20   Mani, Mario, PA-C  insulin isophane & regular human (NOVOLIN 70/30 FLEXPEN RELION) (70-30) 100 UNIT/ML KwikPen Inject 8 Units into the skin 2 (two) times daily. 04/10/21   [place], William C, MD  Insulin Pen Needle (PEN NEEDLES) 32G X 4 MM MISC 1 each by Does not apply route daily. 04/10/21 05/10/21  [place], William C, MD  ondansetron (ZOFRAN) 4 MG tablet Take 1 tablet (4 mg total) by mouth daily as needed for nausea or vomiting. 04/10/21 04/10/22  [place], William C, MD  oxyCODONE (OXY IR/ROXICODONE) 5 MG immediate release tablet Take 1 tablet (5 mg total) by mouth every 4 (four) hours as needed for breakthrough   pain. 04/09/21   Maczis, Barth Kirks, PA-C    Allergies    Patient has no known allergies.  Review of Systems   Review of Systems  Constitutional: Negative for chills and fever.  Cardiovascular: Negative for chest pain.  Gastrointestinal: Positive for abdominal pain, constipation, nausea and vomiting.  Genitourinary: Negative for dysuria and vaginal discharge.  Musculoskeletal: Positive for back pain.  All other systems reviewed and are negative.   Physical Exam Updated Vital Signs BP (!) 174/90   Pulse 92   Temp 98.8 F (37.1 C) (Oral)   Resp 16   LMP  (LMP Unknown) Comment:  neg test  SpO2 99%   Physical Exam Vitals and nursing note reviewed.  Constitutional:      General: She is not in acute distress.    Appearance: She is not ill-appearing.  HENT:     Head: Normocephalic.  Eyes:     Pupils: Pupils are equal, round, and reactive to light.  Cardiovascular:     Rate and Rhythm: Normal rate and regular rhythm.     Pulses: Normal pulses.     Heart sounds: Normal heart sounds. No murmur heard. No friction rub. No gallop.   Pulmonary:     Effort: Pulmonary effort is normal.     Breath sounds: Normal breath sounds.  Abdominal:     General: Abdomen is flat. There is no distension.     Palpations: Abdomen is soft.     Tenderness: There is abdominal tenderness. There is guarding. There is no rebound.     Comments: Diffuse abdominal tenderness most significant on left-side with voluntary guarding.   Musculoskeletal:        General: Normal range of motion.     Cervical back: Neck supple.  Skin:    General: Skin is warm and dry.  Neurological:     General: No focal deficit present.     Mental Status: She is alert.  Psychiatric:        Mood and Affect: Mood normal.        Behavior: Behavior normal.     ED Results / Procedures / Treatments   Labs (all labs ordered are listed, but only abnormal results are displayed) Labs Reviewed  COMPREHENSIVE METABOLIC PANEL - Abnormal; Notable for the following components:      Result Value   Sodium 134 (*)    Potassium 3.2 (*)    Chloride 95 (*)    Glucose, Bld 232 (*)    All other components within normal limits  CBC - Abnormal; Notable for the following components:   WBC 12.3 (*)    All other components within normal limits  URINALYSIS, ROUTINE W REFLEX MICROSCOPIC - Abnormal; Notable for the following components:   Color, Urine AMBER (*)    APPearance HAZY (*)    Glucose, UA >=500 (*)    Ketones, ur 80 (*)    Protein, ur 100 (*)    Leukocytes,Ua TRACE (*)    All other components within normal limits   URINE CULTURE  LIPASE, BLOOD    EKG None  Radiology No results found.  Procedures Procedures   Medications Ordered in ED Medications  iohexol (OMNIPAQUE) 9 MG/ML oral solution 500 mL (500 mLs Oral Contrast Given 04/20/21 1522)  sodium chloride 0.9 % bolus 1,000 mL (1,000 mLs Intravenous New Bag/Given 04/20/21 1435)  ondansetron (ZOFRAN) injection 4 mg (4 mg Intravenous Given 04/20/21 1438)  morphine 4 MG/ML injection 4 mg (4 mg Intravenous Given 04/20/21  1435)  iohexol (OMNIPAQUE) 9 MG/ML oral solution (1 mL  Contrast Given 04/20/21 1522)  potassium chloride SA (KLOR-CON) CR tablet 40 mEq (40 mEq Oral Given 04/20/21 1521)    ED Course  I have reviewed the triage vital signs and the nursing notes.  Pertinent labs & imaging results that were available during my care of the patient were reviewed by me and considered in my medical decision making (see chart for details).  Clinical Course as of 04/20/21 1535  Mon Apr 20, 2021  1413 WBC(!): 12.3 [CA]  1413 Potassium(!): 3.2 [CA]  1413 Glucose(!): 232 [CA]  1435 Leukocytes,Ua(!): TRACE [CA]  1435 Bacteria, UA: NONE SEEN [CA]    Clinical Course User Index [CA] Suzy Bouchard, PA-C   MDM Rules/Calculators/A&P                         45 year old female presents the ED due to left-sided abdominal pain associated with nausea and vomiting x3 days.  Patient had a recent laparoscopic cholecystectomy on 5/4.  History of marijuana use.  No fever or chills.  Upon arrival, stable vitals.  Patient in no acute distress and non- toxic appearing.  Physical exam significant for diffuse abdominal tenderness most significant on left side with voluntary guarding.  Abdominal labs ordered.  Given recent surgery will obtain CT abdomen to rule out postoperative complications. Discussed with CT tech who recommended oral contrast. IVFs, zofran, and morphine given for symptomatic relief.  CMP significant for hyponatremia 134, hypokalemia 3.2, and  hyperglycemia 232.  No anion gap.  Low suspicion for DKA.  UA significant for trace leukocytes, proteinuria, ketonuria, glucosuria.  CBC significant for leukocytosis at 12.3.  Patient handed off to [facility], PA-C at shift change pending CT abdomen. If negative, patient may be discharged home with symptomatic treatment.  Final Clinical Impression(s) / ED Diagnoses Final diagnoses:  Generalized abdominal pain  Non-intractable vomiting with nausea, unspecified vomiting type    Rx / DC Orders ED Discharge Orders         Ordered    ondansetron (ZOFRAN ODT) 4 MG disintegrating tablet  Every 8 hours PRN        04/20/21 1535           Karie Kirks 04/20/21 1535    Milton Ferguson, MD 04/22/21 1002

## 2021-04-20 NOTE — ED Notes (Signed)
Patient asked for blood sugar to be checked due to being a diabetic. Patient took insulin this morning. Has not had her night dose. Patient was also given gingerale to drink.

## 2021-04-21 ENCOUNTER — Encounter (HOSPITAL_COMMUNITY): Payer: Self-pay | Admitting: Internal Medicine

## 2021-04-21 DIAGNOSIS — E119 Type 2 diabetes mellitus without complications: Secondary | ICD-10-CM

## 2021-04-21 DIAGNOSIS — E1165 Type 2 diabetes mellitus with hyperglycemia: Secondary | ICD-10-CM

## 2021-04-21 DIAGNOSIS — IMO0002 Reserved for concepts with insufficient information to code with codable children: Secondary | ICD-10-CM

## 2021-04-21 DIAGNOSIS — R109 Unspecified abdominal pain: Secondary | ICD-10-CM

## 2021-04-21 LAB — CBC
HCT: 36.3 % (ref 36.0–46.0)
Hemoglobin: 12.2 g/dL (ref 12.0–15.0)
MCH: 31.3 pg (ref 26.0–34.0)
MCHC: 33.6 g/dL (ref 30.0–36.0)
MCV: 93.1 fL (ref 80.0–100.0)
Platelets: 287 K/uL (ref 150–400)
RBC: 3.9 MIL/uL (ref 3.87–5.11)
RDW: 11.8 % (ref 11.5–15.5)
WBC: 10.5 K/uL (ref 4.0–10.5)
nRBC: 0 % (ref 0.0–0.2)

## 2021-04-21 LAB — BASIC METABOLIC PANEL
Anion gap: 6 (ref 5–15)
BUN: 6 mg/dL (ref 6–20)
CO2: 27 mmol/L (ref 22–32)
Calcium: 8.4 mg/dL — ABNORMAL LOW (ref 8.9–10.3)
Chloride: 101 mmol/L (ref 98–111)
Creatinine, Ser: 0.55 mg/dL (ref 0.44–1.00)
GFR, Estimated: >60 mL/min (ref >60)
Glucose, Bld: 199 mg/dL — ABNORMAL HIGH (ref 70–99)
Potassium: 4.1 mmol/L (ref 3.5–5.1)
Sodium: 134 mmol/L — ABNORMAL LOW (ref 135–145)

## 2021-04-21 LAB — HEPATIC FUNCTION PANEL
ALT: 27 U/L (ref 0–44)
AST: 25 U/L (ref 15–41)
Albumin: 3.2 g/dL — ABNORMAL LOW (ref 3.5–5.0)
Alkaline Phosphatase: 53 U/L (ref 38–126)
Bilirubin, Direct: 0.2 mg/dL (ref 0.0–0.2)
Indirect Bilirubin: 0.6 mg/dL (ref 0.3–0.9)
Total Bilirubin: 0.8 mg/dL (ref 0.3–1.2)
Total Protein: 6.2 g/dL — ABNORMAL LOW (ref 6.5–8.1)

## 2021-04-21 LAB — GLUCOSE, CAPILLARY
Glucose-Capillary: 221 mg/dL — ABNORMAL HIGH (ref 70–99)
Glucose-Capillary: 198 mg/dL — ABNORMAL HIGH (ref 70–99)
Glucose-Capillary: 151 mg/dL — ABNORMAL HIGH (ref 70–99)

## 2021-04-21 LAB — CBG MONITORING, ED: Glucose-Capillary: 208 mg/dL — ABNORMAL HIGH (ref 70–99)

## 2021-04-21 MED ORDER — MAGIC MOUTHWASH: 15.0000 mL | Freq: Four times a day (QID) | ORAL | Status: DC | PRN

## 2021-04-21 MED ORDER — LIP MEDEX EX OINT
1.0000 "application " | TOPICAL_OINTMENT | Freq: Two times a day (BID) | CUTANEOUS | Status: DC
  Administered 2021-04-21: 1 via TOPICAL
  Filled 2021-04-21: qty 7

## 2021-04-21 MED ORDER — INSULIN GLARGINE 100 UNIT/ML ~~LOC~~ SOLN
5.0000 [IU] | Freq: Every day | SUBCUTANEOUS | Status: DC
  Administered 2021-04-21: 5 [IU] via SUBCUTANEOUS
  Filled 2021-04-21: qty 0.05

## 2021-04-21 MED ORDER — BISACODYL 10 MG RE SUPP
10.0000 mg | Freq: Every day | RECTAL | Status: DC
  Filled 2021-04-21: qty 1

## 2021-04-21 MED ORDER — PHENOL 1.4 % MT LIQD: 2.0000 | OROMUCOSAL | Status: DC | PRN

## 2021-04-21 MED ORDER — HYDROMORPHONE HCL 1 MG/ML IJ SOLN
0.5000 mg | INTRAMUSCULAR | Status: DC | PRN
  Administered 2021-04-21: 1 mg via INTRAVENOUS
  Filled 2021-04-21: qty 2

## 2021-04-21 MED ORDER — DIPHENHYDRAMINE HCL 50 MG/ML IJ SOLN
12.5000 mg | Freq: Four times a day (QID) | INTRAMUSCULAR | Status: DC | PRN
Start: 2021-04-21 — End: 2021-04-21

## 2021-04-21 MED ORDER — CALCIUM POLYCARBOPHIL 625 MG PO TABS
625.0000 mg | ORAL_TABLET | Freq: Two times a day (BID) | ORAL | Status: DC
  Administered 2021-04-21: 625 mg via ORAL
  Filled 2021-04-21: qty 1

## 2021-04-21 MED ORDER — SIMETHICONE 40 MG/0.6ML PO SUSP: 80.0000 mg | Freq: Four times a day (QID) | ORAL | Status: DC | PRN

## 2021-04-21 MED ORDER — MENTHOL 3 MG MT LOZG: 1.0000 | LOZENGE | OROMUCOSAL | Status: DC | PRN

## 2021-04-21 MED ORDER — PROCHLORPERAZINE EDISYLATE 10 MG/2ML IJ SOLN
5.0000 mg | INTRAMUSCULAR | Status: DC | PRN
Start: 2021-04-21 — End: 2021-04-21
  Administered 2021-04-21: 10 mg via INTRAVENOUS
  Filled 2021-04-21: qty 2

## 2021-04-21 MED ORDER — ALUM & MAG HYDROXIDE-SIMETH 200-200-20 MG/5ML PO SUSP: 30.0000 mL | Freq: Four times a day (QID) | ORAL | Status: DC | PRN

## 2021-04-21 MED ORDER — ACETAMINOPHEN 500 MG PO TABS
1000.0000 mg | ORAL_TABLET | Freq: Four times a day (QID) | ORAL | Status: DC
  Administered 2021-04-21: 1000 mg via ORAL
  Filled 2021-04-21: qty 2

## 2021-04-21 MED ORDER — LACTATED RINGERS IV BOLUS
1000.0000 mL | Freq: Once | INTRAVENOUS | Status: AC
  Administered 2021-04-21: 1000 mL via INTRAVENOUS

## 2021-04-21 MED ORDER — METHOCARBAMOL 1000 MG/10ML IJ SOLN: 1000.0000 mg | Freq: Four times a day (QID) | INTRAVENOUS | Status: DC | PRN

## 2021-04-21 MED ORDER — MAGNESIUM CITRATE PO SOLN
1.0000 | Freq: Once | ORAL | Status: DC
  Filled 2021-04-21: qty 296

## 2021-04-21 NOTE — Progress Notes (Signed)
1425- Patient walking off unit unaccompanied. Patient found by nursing and was being escorted back by staff when patient tripped over her sandal and fell on her hands and knees. No injuries. Vital signs are stable.

## 2021-04-21 NOTE — Progress Notes (Signed)
Unsuccessful IV placement attempt x1. Will try to find another site. IV team consulted.

## 2021-04-21 NOTE — Progress Notes (Signed)
Patient will be discharging this afternoon with family. Education on medication will be provided. Belongings were returned to patient.

## 2021-04-21 NOTE — Progress Notes (Signed)
Subjective: This is a patient who is s/p lap chole by Dr. Magnus Ivan on 5/4 that was uncomplicated.  She has done well at home until this past Friday she started to develop some nausea, no vomiting.  She had her first soft BM this day since surgery as well and none since then.  This did not have blood present and was not diarrhea.  She denies fevers.  She is having some L sided abdominal pain.  She presented to the ED yesterday and had a CT scan that revealed colitis.  She has been admitted and we have been asked to see her for evaluation to make sure this isn't a post op complication.  She did receive abx during her stay, but not having symptoms c/w c diff.  She does appear to have chronic nausea as this was the case after her lap chole still as well.  Given her poorly controlled DM, it is possible some of her nausea is related to gastroparesis.  ROS: See above, otherwise other systems negative  Objective: Vital signs in last 24 hours: Temp:  [98.8 F (37.1 C)-99.8 F (37.7 C)] 99.3 F (37.4 C) (05/17 0457) Pulse Rate:  [72-104] 79 (05/17 0457) Resp:  [16-20] 20 (05/17 0457) BP: (115-174)/(77-118) 116/82 (05/17 0457) SpO2:  [99 %-100 %] 99 % (05/17 0457) Last BM Date: 04/17/21  Intake/Output from previous day: 05/16 0701 - 05/17 0700 In: 2833.5 [I.V.:521.7; IV Piggyback:2311.8] Out: -  Intake/Output this shift: No intake/output data recorded.  PE: Gen: NAD Heart: regular Lungs: CTAB Abd: soft, tender in Left upper quadrant, but no peritonitis or rebounding, Few BS, ND, incisions are well healed  Lab Results:  Recent Labs    04/20/21 1111 04/21/21 0452  WBC 12.3* 10.5  HGB 14.2 12.2  HCT 41.5 36.3  PLT 341 287   BMET Recent Labs    04/20/21 1111 04/21/21 0452  NA 134* 134*  K 3.2* 4.1  CL 95* 101  CO2 30 27  GLUCOSE 232* 199*  BUN 10 6  CREATININE 0.63 0.55  CALCIUM 9.3 8.4*   PT/INR No results for input(s): LABPROT, INR in the last 72 hours. CMP      Component Value Date/Time   NA 134 (L) 04/21/2021 0452   K 4.1 04/21/2021 0452   CL 101 04/21/2021 0452   CO2 27 04/21/2021 0452   GLUCOSE 199 (H) 04/21/2021 0452   BUN 6 04/21/2021 0452   CREATININE 0.55 04/21/2021 0452   CALCIUM 8.4 (L) 04/21/2021 0452   PROT 6.2 (L) 04/21/2021 0452   ALBUMIN 3.2 (L) 04/21/2021 0452   AST 25 04/21/2021 0452   ALT 27 04/21/2021 0452   ALKPHOS 53 04/21/2021 0452   BILITOT 0.8 04/21/2021 0452   GFRNONAA >60 04/21/2021 0452   GFRAA >60 10/09/2018 1154   Lipase     Component Value Date/Time   LIPASE 28 04/20/2021 1111       Studies/Results: CT ABDOMEN PELVIS WO CONTRAST  Result Date: 04/20/2021 CLINICAL DATA:  Left lower quadrant abdominal pain. EXAM: CT ABDOMEN AND PELVIS WITHOUT CONTRAST TECHNIQUE: Multidetector CT imaging of the abdomen and pelvis was performed following the standard protocol without IV contrast. COMPARISON:  04/07/2021 FINDINGS: Lower chest: No acute abnormality. Hepatobiliary: Status post cholecystectomy. There is a small volume fluid within the gallbladder fossa adjacent to cholecystectomy clips, nonspecific and likely postoperative. No signs of bile duct dilatation. No focal liver abnormality. Pancreas: Unremarkable. No pancreatic ductal dilatation or surrounding inflammatory  changes. Spleen: Normal in size without focal abnormality. Adrenals/Urinary Tract: Normal adrenal glands. No kidney stone or hydronephrosis. Bladder is unremarkable. Stomach/Bowel: Stomach is nondistended. The appendix is visualized and appears normal. No small bowel wall thickening, inflammation or distension. There is circumferential wall thickening involving the: From the level of the ileocecal valve to the mid descending colon with pericolonic soft tissue stranding compatible with colitis. No signs of bowel obstruction, pneumatosis or bowel perforation. Vascular/Lymphatic: Aortic atherosclerosis. No enlarged abdominal or pelvic lymph nodes. Reproductive:  Uterus and bilateral adnexa are unremarkable. Other: There is a trace amount of free fluid within the dependent portion of the gallbladder. Within the limitations of unenhanced technique there is no focal loculated fluid collections to suggest drainable abscess. Musculoskeletal: There is abnormal asymmetric thickening of the left rectus abdominus musculature which measures 5.2 x 3.2 by 14.6 cm (volume = 130 cm^3). This is compatible with rectus sheath hematoma, image 61/2. IMPRESSION: 1. Examination is positive for circumferential wall thickening involving the colon from the level of the ileocecal valve to the mid descending colon compatible with colitis. No signs of bowel obstruction, pneumatosis or bowel perforation. 2. Interval development of left rectus sheath hematoma with a volume of approximately 130 cc. 3. Status post cholecystectomy. Small volume fluid within the gallbladder fossa is nonspecific and compatible with postoperative change. 4. Aortic atherosclerosis. Aortic Atherosclerosis (ICD10-I70.0). Electronically Signed   By: Signa Kell M.D.   On: 04/20/2021 18:38    Anti-infectives: Anti-infectives (From admission, onward)   Start     Dose/Rate Route Frequency Ordered Stop   04/21/21 1000  ciprofloxacin (CIPRO) IVPB 400 mg        400 mg 200 mL/hr over 60 Minutes Intravenous Every 12 hours 04/20/21 2343     04/21/21 0800  metroNIDAZOLE (FLAGYL) IVPB 500 mg        500 mg 100 mL/hr over 60 Minutes Intravenous Every 8 hours 04/20/21 2328     04/20/21 2130  ciprofloxacin (CIPRO) IVPB 400 mg        400 mg 200 mL/hr over 60 Minutes Intravenous  Once 04/20/21 2127 04/21/21 0005   04/20/21 2130  metroNIDAZOLE (FLAGYL) IVPB 500 mg        500 mg 100 mL/hr over 60 Minutes Intravenous  Once 04/20/21 2127 04/21/21 0133       Assessment/Plan POD 13, s/p lap chole - no post op complications noted.  Small rectus hematoma, but will resolve on its own and is not tender or bothering her Poorly  controlled DM - hgba1c of 11  Colitis, unclear etiology The patient appears to have some wall thickening of her colon on her CT scan c/w colitis.  Her WBC is normal today and she is AF.  She has some nausea, but in hindsight appears this may be a chronic thing for her, which may be related to her poorly controlled DM.  She is not having diarrhea, which is relatively uncommon with colitis, but makes me less suspicious of c diff colitis.  She is on Cipro/Flagyl currently.  It's not clear whether this may be viral vs infectious.  Will defer further abx care to primary service.  I suspect this will be generally self-limiting with symptomatic management.  She is very hungry apparently so given no peritonitis or surgical concern, she can try some clear liquids.  Discussed patient with primary service.  Given no post op complications noted, we will sign off.  FEN - CLD VTE - ok from our standpoint  ID - C/F 5/16 -->   LOS: 0 days    Letha Cape , [facility] 04/21/2021, 9:17 AM Please see Amion for pager number during day hours 7:00am-4:30pm or 7:00am -11:30am on weekends

## 2021-04-21 NOTE — Progress Notes (Signed)
Pack of cigarettes returned to patient upon discharge today.

## 2021-04-21 NOTE — Progress Notes (Addendum)
This writer went to ask patient while in bathroom what she would like to eat and found the bathroom door locked. Patient then exited bathroom, this writer went into the bathroom and could smell cigarette smoke. This Administrator, arts.

## 2021-04-21 NOTE — Progress Notes (Signed)
Patient tolerated clear liquids well. Upgrading to Full liquid diet.

## 2021-04-21 NOTE — Progress Notes (Signed)
Charge nurse made aware by Christa LPN, that patient's bathroom smelled of cigarette smoke. This RN entered patient's room and explained to patient that staff could smell cigarette smoke in her room. This RN requested that patient allow cigarettes to be stored in storage area until discharge due to being unable to smoke while in the hospital. Patient gave nurse pack of cigarettes and they were labeled appropriately and placed in storage area. RN also gave patient the option to use nicotine patches while in hospital. Patient refused at this time.

## 2021-04-21 NOTE — Discharge Summary (Signed)
Physician Discharge Summary  [name] GEX:[id number] DOB: [birth year] DOA: 04/20/2021  PCP: Patient, No Pcp Per (Inactive)  Admit date: 04/20/2021 Discharge date: 04/21/2021  Admitted From: Home  Disposition:  Home   Recommendations for Outpatient Follow-up:  1. Follow up with PCP in 1-2 weeks      Home Health: None  Equipment/Devices: None new  Discharge Condition: Good  CODE STATUS: FULL Diet recommendation: Regular  Brief/Interim Summary: [name] is a 45 y.o. F with newly diagnosed diabetes, recent cholecystitis status postcholecystectomy, discharged 1 week ago who presented with several days of nausea, vomiting, and abdominal discomfort on the left side with constipation.  In the ER, CT showed inflammation of the colon on the right side, and rectus sheath hematoma on the left side.  WBC 12.3.  She was initially going to be discharged with antiemetics and pain control, but could not keep down food and so antibiotics were started for colitis and the hospitalist service were called.         PRINCIPAL HOSPITAL DIAGNOSIS: Constipation    Discharge Diagnoses:  Abdominal pain, vomiting, nausea This appears to be from constipation.  Patient has no diarrhea to suggest colitis.  Overnight here she was treated with antiemetics and fluids and her symptoms resolved.  She was able to advance her diet to solid food, had no vomiting or discomfort, and was comfortable for discharge home.  Antibiotics were stopped.    Diabetes Patient was discharged on her previous insulin.  Has appointment tomorrow with new PCP.         Discharge Instructions  Discharge Instructions    Discharge instructions   Complete by: As directed    From Dr. Loleta Books: You were admitted for abdominal pain and vomiting.  This is called "colitis" and it is usually from food poisoning.  "Food poisoning" means infections, and they go away by themselves   Just eat a bland diet (bananas, rice,  toast, applesauce, mashed potatoes) and drink plenty of fluids  Return for fever, vomiting that won't stop with ondansetron (the nausea medicine you have) or blood in your bowel movements.   Increase activity slowly   Complete by: As directed      Allergies as of 04/21/2021   No Known Allergies     Medication List    STOP taking these medications   oxyCODONE 5 MG immediate release tablet Commonly known as: Oxy IR/ROXICODONE     TAKE these medications   blood glucose meter kit and supplies Kit Dispense based on patient and insurance preference. Use up to four times daily as directed.   famotidine 20 MG tablet Commonly known as: PEPCID Take 1 tablet (20 mg total) by mouth 2 (two) times daily.   NovoLIN 70/30 FlexPen Relion (70-30) 100 UNIT/ML KwikPen Generic drug: insulin isophane & regular human Inject 8 Units into the skin 2 (two) times daily.   ondansetron 4 MG disintegrating tablet Commonly known as: Zofran ODT Take 1 tablet (4 mg total) by mouth every 8 (eight) hours as needed for nausea or vomiting.   ondansetron 4 MG tablet Commonly known as: Zofran Take 1 tablet (4 mg total) by mouth daily as needed for nausea or vomiting.   Pen Needles 32G X 4 MM Misc 1 each by Does not apply route daily.       No Known Allergies  Consultations:  None   Procedures/Studies: CT ABDOMEN PELVIS WO CONTRAST  Result Date: 04/20/2021 CLINICAL DATA:  Left lower quadrant abdominal pain. EXAM:  CT ABDOMEN AND PELVIS WITHOUT CONTRAST TECHNIQUE: Multidetector CT imaging of the abdomen and pelvis was performed following the standard protocol without IV contrast. COMPARISON:  04/07/2021 FINDINGS: Lower chest: No acute abnormality. Hepatobiliary: Status post cholecystectomy. There is a small volume fluid within the gallbladder fossa adjacent to cholecystectomy clips, nonspecific and likely postoperative. No signs of bile duct dilatation. No focal liver abnormality. Pancreas: Unremarkable.  No pancreatic ductal dilatation or surrounding inflammatory changes. Spleen: Normal in size without focal abnormality. Adrenals/Urinary Tract: Normal adrenal glands. No kidney stone or hydronephrosis. Bladder is unremarkable. Stomach/Bowel: Stomach is nondistended. The appendix is visualized and appears normal. No small bowel wall thickening, inflammation or distension. There is circumferential wall thickening involving the: From the level of the ileocecal valve to the mid descending colon with pericolonic soft tissue stranding compatible with colitis. No signs of bowel obstruction, pneumatosis or bowel perforation. Vascular/Lymphatic: Aortic atherosclerosis. No enlarged abdominal or pelvic lymph nodes. Reproductive: Uterus and bilateral adnexa are unremarkable. Other: There is a trace amount of free fluid within the dependent portion of the gallbladder. Within the limitations of unenhanced technique there is no focal loculated fluid collections to suggest drainable abscess. Musculoskeletal: There is abnormal asymmetric thickening of the left rectus abdominus musculature which measures 5.2 x 3.2 by 14.6 cm (volume = 130 cm^3). This is compatible with rectus sheath hematoma, image 61/2. IMPRESSION: 1. Examination is positive for circumferential wall thickening involving the colon from the level of the ileocecal valve to the mid descending colon compatible with colitis. No signs of bowel obstruction, pneumatosis or bowel perforation. 2. Interval development of left rectus sheath hematoma with a volume of approximately 130 cc. 3. Status post cholecystectomy. Small volume fluid within the gallbladder fossa is nonspecific and compatible with postoperative change. 4. Aortic atherosclerosis. Aortic Atherosclerosis (ICD10-I70.0). Electronically Signed   By: Kerby Moors M.D.   On: 04/20/2021 18:38   CT Abdomen Pelvis W Contrast  Result Date: 04/07/2021 CLINICAL DATA:  Left upper quadrant abdominal pain EXAM: CT ABDOMEN  AND PELVIS WITH CONTRAST TECHNIQUE: Multidetector CT imaging of the abdomen and pelvis was performed using the standard protocol following bolus administration of intravenous contrast. CONTRAST:  175m OMNIPAQUE IOHEXOL 300 MG/ML SOLN, <See Chart> OMNIPAQUE IOHEXOL 300 MG/ML SOLN COMPARISON:  Abdominal ultrasound 04/07/2021 FINDINGS: The patient was apparently unable to raise the right arm during imaging, resulting in mild reduction of signal to noise ratio. Lower chest: Linear subsegmental atelectasis or scarring in the right middle lobe and posterior basal segment of the left lower lobe. Mild distal esophageal wall thickening, esophagitis would be the most common cause. Hepatobiliary: Multiple gallstones in the gallbladder likely with sludge in the gallbladder as well. No pericholecystic fluid identified. Focal steatosis in segment 4b of the liver adjacent to the falciform ligament. Pancreas: Unremarkable Spleen: Unremarkable Adrenals/Urinary Tract: Unremarkable Stomach/Bowel: Unremarkable.  Normal appendix.  No dilated bowel. Vascular/Lymphatic: Aortoiliac atherosclerotic vascular disease. No pathologic adenopathy. Reproductive: Unremarkable Other: No supplemental non-categorized findings. Musculoskeletal: Unremarkable IMPRESSION: 1. Cholelithiasis. 2. Wall thickening in the distal esophagus, esophagitis would be the most common cause. 3.  Aortic Atherosclerosis (ICD10-I70.0). 4. No specific left upper quadrant finding to fully explain the patient's abdominal pain. Electronically Signed   By: WVan ClinesM.D.   On: 04/07/2021 20:22   UKoreaAbdomen Limited RUQ (LIVER/GB)  Result Date: 04/07/2021 CLINICAL DATA:  Abdominal pain. EXAM: ULTRASOUND ABDOMEN LIMITED RIGHT UPPER QUADRANT COMPARISON:  Ultrasound, 03/08/2021. FINDINGS: Gallbladder: Gallbladder is filled with stones and sludge. No wall thickening or  pericholecystic fluid. Positive sonographic Murphy's sign. Common bile duct: Diameter: 4 mm. Liver: No  focal lesion identified. Within normal limits in parenchymal echogenicity. Portal vein is patent on color Doppler imaging with normal direction of blood flow towards the liver. Other: None. IMPRESSION: 1. Gallbladder filled with sludge and stones with a positive sonographic Murphy sign, but no wall thickening or pericholecystic fluid. This appearance is stable from the prior study. No convincing acute cholecystitis. 2. Normal liver.  No bile duct dilation. Electronically Signed   By: Lajean Manes M.D.   On: 04/07/2021 16:07       Subjective: Nausea and vomiting are resolved.  No fever overnight.  No confusion, respiratory distress.  She is still somewhat constipated, but is feeling better.  Discharge Exam: Vitals:   04/21/21 1400 04/21/21 1431  BP: 107/60 109/69  Pulse: 75 79  Resp: 18 18  Temp: 98.6 F (37 C) 98.2 F (36.8 C)  SpO2: 99% 100%   Vitals:   04/21/21 0204 04/21/21 0457 04/21/21 1400 04/21/21 1431  BP: 135/81 116/82 107/60 109/69  Pulse: 83 79 75 79  Resp: $Remo'20 20 18 18  'vPDze$ Temp: 99.6 F (37.6 C) 99.3 F (37.4 C) 98.6 F (37 C) 98.2 F (36.8 C)  TempSrc: Oral Oral Oral Oral  SpO2: 100% 99% 99% 100%    General: Pt is alert, awake, not in acute distress Cardiovascular: RRR, nl S1-S2, no murmurs appreciated.   No LE edema.   Respiratory: Normal respiratory rate and rhythm.  CTAB without rales or wheezes. Abdominal: Abdomen soft and non-tender.  No distension or HSM.   Neuro/Psych: Strength symmetric in upper and lower extremities.  Judgment and insight appear normal.   The results of significant diagnostics from this hospitalization (including imaging, microbiology, ancillary and laboratory) are listed below for reference.     Microbiology: Recent Results (from the past 240 hour(s))  Resp Panel by RT-PCR (Flu A&B, Covid) Nasopharyngeal Swab     Status: None   Collection Time: 04/20/21 10:15 PM   Specimen: Nasopharyngeal Swab; Nasopharyngeal(NP) swabs in vial  transport medium  Result Value Ref Range Status   SARS Coronavirus 2 by RT PCR NEGATIVE NEGATIVE Final    Comment: (NOTE) SARS-CoV-2 target nucleic acids are NOT DETECTED.  The SARS-CoV-2 RNA is generally detectable in upper respiratory specimens during the acute phase of infection. The lowest concentration of SARS-CoV-2 viral copies this assay can detect is 138 copies/mL. A negative result does not preclude SARS-Cov-2 infection and should not be used as the sole basis for treatment or other patient management decisions. A negative result may occur with  improper specimen collection/handling, submission of specimen other than nasopharyngeal swab, presence of viral mutation(s) within the areas targeted by this assay, and inadequate number of viral copies(<138 copies/mL). A negative result must be combined with clinical observations, patient history, and epidemiological information. The expected result is Negative.  Fact Sheet for Patients:  EntrepreneurPulse.com.au  Fact Sheet for Healthcare Providers:  IncredibleEmployment.be  This test is no t yet approved or cleared by the [place] FDA and  has been authorized for detection and/or diagnosis of SARS-CoV-2 by FDA under an Emergency Use Authorization (EUA). This EUA will remain  in effect (meaning this test can be used) for the duration of the COVID-19 declaration under Section 564(b)(1) of the Act, 21 U.S.C.section 360bbb-3(b)(1), unless the authorization is terminated  or revoked sooner.       Influenza A by PCR NEGATIVE NEGATIVE Final   Influenza B  by PCR NEGATIVE NEGATIVE Final    Comment: (NOTE) The Xpert Xpress SARS-CoV-2/FLU/RSV plus assay is intended as an aid in the diagnosis of influenza from Nasopharyngeal swab specimens and should not be used as a sole basis for treatment. Nasal washings and aspirates are unacceptable for Xpert Xpress SARS-CoV-2/FLU/RSV testing.  Fact  Sheet for Patients: EntrepreneurPulse.com.au  Fact Sheet for Healthcare Providers: IncredibleEmployment.be  This test is not yet approved or cleared by the [place] FDA and has been authorized for detection and/or diagnosis of SARS-CoV-2 by FDA under an Emergency Use Authorization (EUA). This EUA will remain in effect (meaning this test can be used) for the duration of the COVID-19 declaration under Section 564(b)(1) of the Act, 21 U.S.C. section 360bbb-3(b)(1), unless the authorization is terminated or revoked.  Performed at [facility], [place] [address], [place], [place] [address]      Labs: BNP (last 3 results) No results for input(s): BNP in the last 8760 hours. Basic Metabolic Panel: Recent Labs  Lab 04/20/21 1111 04/21/21 0452  NA 134* 134*  K 3.2* 4.1  CL 95* 101  CO2 30 27  GLUCOSE 232* 199*  BUN 10 6  CREATININE 0.63 0.55  CALCIUM 9.3 8.4*   Liver Function Tests: Recent Labs  Lab 04/20/21 1111 04/21/21 0452  AST 33 25  ALT 35 27  ALKPHOS 66 53  BILITOT 0.9 0.8  PROT 7.5 6.2*  ALBUMIN 3.8 3.2*   Recent Labs  Lab 04/20/21 1111  LIPASE 28   No results for input(s): AMMONIA in the last 168 hours. CBC: Recent Labs  Lab 04/20/21 1111 04/21/21 0452  WBC 12.3* 10.5  HGB 14.2 12.2  HCT 41.5 36.3  MCV 91.4 93.1  PLT 341 287   Cardiac Enzymes: No results for input(s): CKTOTAL, CKMB, CKMBINDEX, TROPONINI in the last 168 hours. BNP: Invalid input(s): POCBNP CBG: Recent Labs  Lab 04/20/21 1945 04/21/21 0021 04/21/21 0743 04/21/21 1152 04/21/21 1606  GLUCAP 187* 208* 221* 198* 151*   D-Dimer No results for input(s): DDIMER in the last 72 hours. Hgb A1c No results for input(s): HGBA1C in the last 72 hours. Lipid Profile No results for input(s): CHOL, HDL, LDLCALC, TRIG, CHOLHDL, LDLDIRECT in the last 72 hours. Thyroid function studies No results for input(s): TSH, T4TOTAL, T3FREE,  THYROIDAB in the last 72 hours.  Invalid input(s): FREET3 Anemia work up No results for input(s): VITAMINB12, FOLATE, FERRITIN, TIBC, IRON, RETICCTPCT in the last 72 hours. Urinalysis    Component Value Date/Time   COLORURINE AMBER (A) 04/20/2021 1405   APPEARANCEUR HAZY (A) 04/20/2021 1405   LABSPEC 1.021 04/20/2021 1405   PHURINE 6.0 04/20/2021 1405   GLUCOSEU >=500 (A) 04/20/2021 1405   HGBUR NEGATIVE 04/20/2021 1405   BILIRUBINUR NEGATIVE 04/20/2021 1405   KETONESUR 80 (A) 04/20/2021 1405   PROTEINUR 100 (A) 04/20/2021 1405   UROBILINOGEN 1.0 07/01/2009 0722   NITRITE NEGATIVE 04/20/2021 1405   LEUKOCYTESUR TRACE (A) 04/20/2021 1405   Sepsis Labs Invalid input(s): PROCALCITONIN,  WBC,  LACTICIDVEN Microbiology Recent Results (from the past 240 hour(s))  Resp Panel by RT-PCR (Flu A&B, Covid) Nasopharyngeal Swab     Status: None   Collection Time: 04/20/21 10:15 PM   Specimen: Nasopharyngeal Swab; Nasopharyngeal(NP) swabs in vial transport medium  Result Value Ref Range Status   SARS Coronavirus 2 by RT PCR NEGATIVE NEGATIVE Final    Comment: (NOTE) SARS-CoV-2 target nucleic acids are NOT DETECTED.  The SARS-CoV-2 RNA is generally detectable in upper  respiratory specimens during the acute phase of infection. The lowest concentration of SARS-CoV-2 viral copies this assay can detect is 138 copies/mL. A negative result does not preclude SARS-Cov-2 infection and should not be used as the sole basis for treatment or other patient management decisions. A negative result may occur with  improper specimen collection/handling, submission of specimen other than nasopharyngeal swab, presence of viral mutation(s) within the areas targeted by this assay, and inadequate number of viral copies(<138 copies/mL). A negative result must be combined with clinical observations, patient history, and epidemiological information. The expected result is Negative.  Fact Sheet for Patients:   EntrepreneurPulse.com.au  Fact Sheet for Healthcare Providers:  IncredibleEmployment.be  This test is no t yet approved or cleared by the [place] FDA and  has been authorized for detection and/or diagnosis of SARS-CoV-2 by FDA under an Emergency Use Authorization (EUA). This EUA will remain  in effect (meaning this test can be used) for the duration of the COVID-19 declaration under Section 564(b)(1) of the Act, 21 U.S.C.section 360bbb-3(b)(1), unless the authorization is terminated  or revoked sooner.       Influenza A by PCR NEGATIVE NEGATIVE Final   Influenza B by PCR NEGATIVE NEGATIVE Final    Comment: (NOTE) The Xpert Xpress SARS-CoV-2/FLU/RSV plus assay is intended as an aid in the diagnosis of influenza from Nasopharyngeal swab specimens and should not be used as a sole basis for treatment. Nasal washings and aspirates are unacceptable for Xpert Xpress SARS-CoV-2/FLU/RSV testing.  Fact Sheet for Patients: EntrepreneurPulse.com.au  Fact Sheet for Healthcare Providers: IncredibleEmployment.be  This test is not yet approved or cleared by the [place] FDA and has been authorized for detection and/or diagnosis of SARS-CoV-2 by FDA under an Emergency Use Authorization (EUA). This EUA will remain in effect (meaning this test can be used) for the duration of the COVID-19 declaration under Section 564(b)(1) of the Act, 21 U.S.C. section 360bbb-3(b)(1), unless the authorization is terminated or revoked.  Performed at [facility], [place] [address], [place], [place] [address]      Time coordinating discharge: 35 minutes The [place] controlled substances registry was reviewed for this patient     30 Day Unplanned Readmission Risk Score   Flowsheet Row ED to Hosp-Admission (Discharged) from 04/07/2021 in [facility]  30 Day Unplanned Readmission Risk Score (%) 11.57  Filed at 04/10/2021 1600     This score is the patient's risk of an unplanned readmission within 30 days of being discharged (0 -100%). The score is based on dignosis, age, lab data, medications, orders, and past utilization.   Low:  0-14.9   Medium: 15-21.9   High: 22-29.9   Extreme: 30 and above           SIGNED:   Edwin Dada, MD  [facility] 04/21/2021, 4:40 PM

## 2021-04-21 NOTE — Progress Notes (Signed)
   04/21/21 1431  What Happened  Was fall witnessed? Yes  Who witnessed fall? Christa LPN  Patients activity before fall ambulating-unassisted  Point of contact other (comment) (hands and knees)  Was patient injured? No  Follow Up  MD notified Dr. Maryfrances Bunnell  Time MD notified [phone]  Family notified No - patient refusal  Additional tests No  Simple treatment Other (comment) (n/a)  Progress note created (see row info) Yes  Adult Fall Risk Assessment  Risk Factor Category (scoring not indicated) Fall has occurred during this admission (document High fall risk)  Patient Fall Risk Level High fall risk  Adult Fall Risk Interventions  Required Bundle Interventions *See Row Information* High fall risk - low, moderate, and high requirements implemented  Additional Interventions Use of appropriate toileting equipment (bedpan, BSC, etc.)  Screening for Fall Injury Risk (To be completed on HIGH fall risk patients) - Assessing Need for Floor Mats  Risk For Fall Injury- Criteria for Floor Mats None identified - No additional interventions needed  Vitals  Temp 98.2 F (36.8 C)  Temp Source Oral  BP 109/69  MAP (mmHg) 82  BP Location Right Arm  BP Method Automatic  Patient Position (if appropriate) Sitting  Pulse Rate 79  Pulse Rate Source Monitor  Resp 18  Oxygen Therapy  SpO2 100 %  O2 Device Room Air  Pain Assessment  Pain Scale 0-10  Pain Score 0  PCA/Epidural/Spinal Assessment  Respiratory Pattern Regular;Unlabored  Neurological  Neuro (WDL) WDL  Glasgow Coma Scale  Eye Opening 4  Best Verbal Response (NON-intubated) 5  Best Motor Response 6  Glasgow Coma Scale Score 15  Musculoskeletal  Musculoskeletal (WDL) WDL  Assistive Device None  Integumentary  Integumentary (WDL) X  RN Assisting with Skin Assessment on Admission Christa LPN (reassessed upon fall)  Skin Integrity Surgical Incision (see LDA)  Pain Assessment  Result of Injury No  Pain Screening  Response to  Interventions n/a  Effect of Pain on Daily Activities n/a  Clinical Progression Not changed

## 2021-04-22 ENCOUNTER — Other Ambulatory Visit: Payer: Self-pay

## 2021-04-22 ENCOUNTER — Ambulatory Visit (INDEPENDENT_AMBULATORY_CARE_PROVIDER_SITE_OTHER): Payer: Self-pay | Admitting: Primary Care

## 2021-04-22 VITALS — BP 115/81 | HR 80 | Temp 98.2°F | Wt 146.2 lb

## 2021-04-22 DIAGNOSIS — Z720 Tobacco use: Secondary | ICD-10-CM

## 2021-04-22 DIAGNOSIS — Z09 Encounter for follow-up examination after completed treatment for conditions other than malignant neoplasm: Secondary | ICD-10-CM

## 2021-04-22 DIAGNOSIS — Z13220 Encounter for screening for lipoid disorders: Secondary | ICD-10-CM

## 2021-04-22 DIAGNOSIS — Z7189 Other specified counseling: Secondary | ICD-10-CM

## 2021-04-22 DIAGNOSIS — Z7689 Persons encountering health services in other specified circumstances: Secondary | ICD-10-CM

## 2021-04-22 DIAGNOSIS — E1165 Type 2 diabetes mellitus with hyperglycemia: Secondary | ICD-10-CM

## 2021-04-22 LAB — URINE CULTURE

## 2021-04-22 MED ORDER — METFORMIN HCL 1000 MG PO TABS
1000.0000 mg | ORAL_TABLET | Freq: Two times a day (BID) | ORAL | 3 refills | Status: DC
  Filled 2021-04-22: qty 60, 30d supply, fill #0

## 2021-04-22 MED ORDER — SITAGLIPTIN PHOSPHATE 100 MG PO TABS
100.0000 mg | ORAL_TABLET | Freq: Every day | ORAL | 1 refills | Status: DC
  Filled 2021-04-22: qty 30, 30d supply, fill #0

## 2021-04-22 NOTE — Patient Instructions (Signed)
Diabetes Mellitus Action Plan Following a diabetes action plan is a way for you to manage your diabetes (diabetes mellitus) symptoms. The plan is color-coded to help you understand what actions you need to take based on any symptoms you are having.  If you have symptoms in the red zone, you need medical care right away.  If you have symptoms in the yellow zone, you are having problems.  If you have symptoms in the green zone, you are doing well. Learning about and understanding diabetes can take time. Follow the plan that you develop with your health care provider. Know the target range for your blood sugar (glucose) level, and review your treatment plan with your health care provider at each visit. The target range for my blood sugar level is __________________________ mg/dL. Red zone Get medical help right away if you have any of the following symptoms:  A blood sugar test result that is below 54 mg/dL (3 mmol/L).  A blood sugar test result that is at or above 240 mg/dL (13.3 mmol/L) for 2 days in a row.  Confusion or trouble thinking clearly.  Difficulty breathing.  Sickness or a fever for 2 or more days that is not getting better.  Moderate or large ketone levels in your urine.  Feeling tired or having no energy. If you have any red zone symptoms, do not wait to see if the symptoms will go away. Get medical help right away. Call your local emergency services (911 in the [place] Do not drive yourself to the hospital. If you have severely low blood sugar (severe hypoglycemia) and you cannot eat or drink, you may need glucagon. Make sure a family member or close friend knows how to check your blood sugar and how to give you glucagon. You may need to be treated in a hospital for this condition.   Yellow zone If you have any of the following symptoms, your diabetes is not under control and you may need to make some changes:  A blood sugar test result that is at or above 240 mg/dL (13.3  mmol/L) for 2 days in a row.  Blood sugar test results that are below 70 mg/dL (3.9 mmol/L).  Other symptoms of hypoglycemia, such as: ? Shaking or feeling light-headed. ? Confusion or irritability. ? Feeling hungry. ? Having a fast heartbeat. If you have any yellow zone symptoms:  Treat your hypoglycemia by eating or drinking 15 grams of a rapid-acting carbohydrate. Follow the 15:15 rule: ? Take 15 grams of a rapid-acting carbohydrate, such as:  1 tube of glucose gel.  4 glucose pills.  4 oz (120 mL) of fruit juice.  4 oz (120 mL) of regular (not diet) soda. ? Check your blood sugar 15 minutes after you take the carbohydrate. ? If the repeat blood sugar test is still at or below 70 mg/dL (3.9 mmol/L), take 15 grams of a carbohydrate again. ? If your blood sugar does not increase above 70 mg/dL (3.9 mmol/L) after 3 tries, get medical help right away. ? After your blood sugar returns to normal, eat a meal or a snack within 1 hour.  Keep taking your daily medicines as told by your health care provider.  Check your blood sugar more often than you normally would. ? Write down your results. ? Call your health care provider if you have trouble keeping your blood sugar in your target range.   Green zone These signs mean you are doing well and you can continue what you   are doing to manage your diabetes:  Your blood sugar is within your personal target range. For most people, a blood sugar level before a meal (preprandial) should be 80-130 mg/dL (4.4-7.2 mmol/L).  You feel well, and you are able to do daily activities. If you are in the green zone, continue to manage your diabetes as told by your health care provider. To do this:  Eat a healthy diet.  Exercise regularly.  Check your blood sugar as told by your health care provider.  Take your medicines as told by your health care provider.   Where to find more information  American Diabetes Association (ADA):  diabetes.org  Association of Diabetes Care & Education Specialists (ADCES): diabeteseducator.org Summary  Following a diabetes action plan is a way for you to manage your diabetes symptoms. The plan is color-coded to help you understand what actions you need to take based on any symptoms you are having.  Follow the plan that you develop with your health care provider. Make sure you know your personal target blood sugar level.  Review your treatment plan with your health care provider at each visit. This information is not intended to replace advice given to you by your health care provider. Make sure you discuss any questions you have with your health care provider. Document Revised: 05/29/2020 Document Reviewed: 05/29/2020 Elsevier Patient Education  2021 Elsevier Inc.  

## 2021-04-22 NOTE — Progress Notes (Signed)
[facility]   Subjective:    [name] is a 45 y.o. female presents for hospital follow up and establish care. Presented to the ED with  days of nausea vomiting and abdominal discomfort with abdominal discomfort and mostly on the left side constantly no diarrhea but has constipation  Admit date to the hospital was 04/20/21, patient was discharged from the hospital on 04/21/21, patient was admitted for: Acute cholecystitis s/p lap cholecystectomy 5/4/202 ,Abdominal pain, Hyperglycemia, GERD (gastroesophageal reflux disease), Nausea & vomiting and Colitis    Past Medical History:  Diagnosis Date  . Acute cholecystitis s/p lap cholecystectomy 04/08/2021 04/07/2021  . Cholelithiasis   . Diabetes mellitus type 2 in nonobese ([place]) 04/20/2021  . GERD (gastroesophageal reflux disease)   . Tobacco abuse      No Known Allergies    Current Outpatient Medications on File Prior to Visit  Medication Sig Dispense Refill  . blood glucose meter kit and supplies KIT Dispense based on patient and insurance preference. Use up to four times daily as directed. 1 each 0  . famotidine (PEPCID) 20 MG tablet Take 1 tablet (20 mg total) by mouth 2 (two) times daily. 60 tablet 0  . insulin isophane & regular human (NOVOLIN 70/30 FLEXPEN RELION) (70-30) 100 UNIT/ML KwikPen Inject 8 Units into the skin 2 (two) times daily. 15 mL 0  . Insulin Pen Needle (PEN NEEDLES) 32G X 4 MM MISC 1 each by Does not apply route daily. 100 each 0  . ondansetron (ZOFRAN ODT) 4 MG disintegrating tablet Take 1 tablet (4 mg total) by mouth every 8 (eight) hours as needed for nausea or vomiting. 20 tablet 0  . ondansetron (ZOFRAN) 4 MG tablet Take 1 tablet (4 mg total) by mouth daily as needed for nausea or vomiting. 30 tablet 1   No current facility-administered medications on file prior to visit.     Review of System: Review of Systems  Constitutional: Positive for weight loss.       220 year ago now146.2   Eyes: Positive for blurred vision.  Gastrointestinal: Positive for nausea.       Polydipsia  Genitourinary: Positive for frequency.  All other systems reviewed and are negative.   Objective:  LMP  (LMP Unknown) Comment: neg test.  BP 115/81 (BP Location: Right Arm)   Pulse 80   Temp 98.2 F (36.8 C) (Oral)   Wt 146 lb 3.2 oz (66.3 kg)   LMP  (LMP Unknown) Comment: neg test  SpO2 100%   BMI 21.59 kg/m   Physical Exam: General Appearance:  nourished, thin frame in no apparent distress. Eyes: PERRLA, EOMs, conjunctiva no swelling or erythema Sinuses: No Frontal/maxillary tenderness ENT/Mouth: Ext aud canals clear, TMs without erythema, bulging. Hearing normal.  Neck: Supple, thyroid normal.  Respiratory: Respiratory effort normal, BS equal bilaterally without rales, rhonchi, wheezing or stridor.  Cardio: RRR with no MRGs. Brisk peripheral pulses without edema.  Abdomen: Soft, + BS.  Non tender, no guarding, rebound, hernias, masses. Lymphatics: Non tender without lymphadenopathy.  Musculoskeletal: Full ROM, 5/5 strength, normal gait.  Skin: Warm, dry without rashes, lesions, ecchymosis.  Neuro: Cranial nerves intact. Normal muscle tone, no cerebellar symptoms. Sensation intact.  Psych: Awake and oriented X 3, normal affect, Insight and Judgment appropriate.    Assessment:  Diagnoses and all orders for this visit:  Uncontrolled type 2 diabetes mellitus with hyperglycemia ([place]) ADA recommends the following therapeutic goals for glycemic control related to A1c measurements: Goal  of therapy: Less than 6.5 hemoglobin A1c.  Reference clinical practice recommendations. Foods that are high in carbohydrates are the following rice, potatoes, breads, sugars, and pastas.  Reduction in the intake (eating) will assist in lowering your blood sugars. -     metFORMIN (GLUCOPHAGE) 1000 MG tablet; Take 1 tablet (1,000 mg total) by mouth 2 (two) times daily with a meal. -     sitaGLIPtin  (JANUVIA) 100 MG tablet; Take 1 tablet (100 mg total) by mouth daily. -     Cancel: Microalbumin, urine  Encounter to establish care Establish care with PCP  Hospital discharge follow-up Management of T2D and establish care   Encounter for diabetes education Discussed the importance of healthy eating habits, low-carbohydrate diet, low-sugar diet, regular aerobic exercise (at least 150 minutes a week as tolerated) and medication compliance to achieve or maintain control of diabetes.  Your A1C is a measure of your sugar over the past 3 months and is not affected by what you have eaten over the past few days. Diabetes increases your chances of stroke and heart attack and can  Cause blindness and kidney failure . Decrease bad carbs like white bread, white rice, potatoes, corn, soft drinks, pasta, cereals, refined sugars, sweet tea, dried fruits, and fruit juice. Good carbs are okay to eat in moderation like sweet potatoes, brown rice, whole grain pasta/bread, most fruit (except dried fruit) and you can eat as many veggies as you want.   Greater than 6.5 is considered diabetic. Between 6.4 and 5.7 is prediabetic If your A1C is less than 5.7 you are NOT diabetic.  Targets for Glucose Readings: Time of Check Target for patients WITHOUT Diabetes Target for DIABETICS  Before Meals Less than 100  less than 150  Two hours after meals Less than 200  Less than 250    Lipid screening FLP  Tobacco abuse Nicotine affect every organ in the body second leading cause of death.  Increased risk for lung cancer and other respiratory diseases recommend cessation.  This will be reminded at each clinical visit.     This note has been created with Surveyor, quantity. Any transcriptional errors are unintentional.   Kerin Perna, NP 04/22/2021, 10:44 AM

## 2021-04-23 ENCOUNTER — Emergency Department (HOSPITAL_COMMUNITY)
Admission: EM | Admit: 2021-04-23 | Discharge: 2021-04-24 | Disposition: A | Discharge status: Home / Self Care | Payer: Medicaid Other | Attending: Emergency Medicine | Admitting: Emergency Medicine

## 2021-04-23 ENCOUNTER — Encounter (INDEPENDENT_AMBULATORY_CARE_PROVIDER_SITE_OTHER): Payer: Self-pay | Admitting: Primary Care

## 2021-04-23 ENCOUNTER — Other Ambulatory Visit: Payer: Self-pay

## 2021-04-23 ENCOUNTER — Encounter (HOSPITAL_COMMUNITY): Payer: Self-pay

## 2021-04-23 ENCOUNTER — Emergency Department (HOSPITAL_COMMUNITY): Payer: Medicaid Other

## 2021-04-23 DIAGNOSIS — E119 Type 2 diabetes mellitus without complications: Secondary | ICD-10-CM | POA: Diagnosis not present

## 2021-04-23 DIAGNOSIS — R109 Unspecified abdominal pain: Secondary | ICD-10-CM | POA: Diagnosis not present

## 2021-04-23 DIAGNOSIS — R1032 Left lower quadrant pain: Secondary | ICD-10-CM | POA: Diagnosis not present

## 2021-04-23 DIAGNOSIS — Z794 Long term (current) use of insulin: Secondary | ICD-10-CM | POA: Diagnosis not present

## 2021-04-23 DIAGNOSIS — R52 Pain, unspecified: Secondary | ICD-10-CM | POA: Diagnosis not present

## 2021-04-23 DIAGNOSIS — R1084 Generalized abdominal pain: Secondary | ICD-10-CM | POA: Diagnosis not present

## 2021-04-23 DIAGNOSIS — F17200 Nicotine dependence, unspecified, uncomplicated: Secondary | ICD-10-CM | POA: Insufficient documentation

## 2021-04-23 DIAGNOSIS — Z7984 Long term (current) use of oral hypoglycemic drugs: Secondary | ICD-10-CM | POA: Insufficient documentation

## 2021-04-23 DIAGNOSIS — R112 Nausea with vomiting, unspecified: Secondary | ICD-10-CM | POA: Insufficient documentation

## 2021-04-23 DIAGNOSIS — I10 Essential (primary) hypertension: Secondary | ICD-10-CM | POA: Diagnosis not present

## 2021-04-23 DIAGNOSIS — F606 Avoidant personality disorder: Secondary | ICD-10-CM | POA: Diagnosis not present

## 2021-04-23 LAB — URINALYSIS, ROUTINE W REFLEX MICROSCOPIC
Bilirubin Urine: NEGATIVE
Glucose, UA: >=500 mg/dL — AB
Hgb urine dipstick: NEGATIVE
Ketones, ur: 20 mg/dL — AB
Nitrite: NEGATIVE
Protein, ur: NEGATIVE mg/dL
Specific Gravity, Urine: 1.009 (ref 1.005–1.030)
pH: 9 — ABNORMAL HIGH (ref 5.0–8.0)

## 2021-04-23 LAB — CBC WITH DIFFERENTIAL/PLATELET
Abs Immature Granulocytes: 0.04 10*3/uL (ref 0.00–0.07)
Basophils Absolute: 0 10*3/uL (ref 0.0–0.1)
Basophils Relative: 0 %
Eosinophils Absolute: 0.1 10*3/uL (ref 0.0–0.5)
Eosinophils Relative: 1 %
HCT: 42.3 % (ref 36.0–46.0)
Hemoglobin: 14.1 g/dL (ref 12.0–15.0)
Immature Granulocytes: 0 %
Lymphocytes Relative: 27 %
Lymphs Abs: 2.9 10*3/uL (ref 0.7–4.0)
MCH: 31 pg (ref 26.0–34.0)
MCHC: 33.3 g/dL (ref 30.0–36.0)
MCV: 93 fL (ref 80.0–100.0)
Monocytes Absolute: 0.6 10*3/uL (ref 0.1–1.0)
Monocytes Relative: 6 %
Neutro Abs: 6.8 10*3/uL (ref 1.7–7.7)
Neutrophils Relative %: 66 %
Platelets: 329 10*3/uL (ref 150–400)
RBC: 4.55 MIL/uL (ref 3.87–5.11)
RDW: 12 % (ref 11.5–15.5)
WBC: 10.6 10*3/uL — ABNORMAL HIGH (ref 4.0–10.5)
nRBC: 0 % (ref 0.0–0.2)

## 2021-04-23 LAB — COMPREHENSIVE METABOLIC PANEL
ALT: 36 U/L (ref 0–44)
AST: 37 U/L (ref 15–41)
Albumin: 4.1 g/dL (ref 3.5–5.0)
Alkaline Phosphatase: 74 U/L (ref 38–126)
Anion gap: 8 (ref 5–15)
BUN: 5 mg/dL — ABNORMAL LOW (ref 6–20)
CO2: 27 mmol/L (ref 22–32)
Calcium: 9.2 mg/dL (ref 8.9–10.3)
Chloride: 102 mmol/L (ref 98–111)
Creatinine, Ser: 0.53 mg/dL (ref 0.44–1.00)
GFR, Estimated: >60 mL/min (ref >60)
Glucose, Bld: 204 mg/dL — ABNORMAL HIGH (ref 70–99)
Potassium: 3.9 mmol/L (ref 3.5–5.1)
Sodium: 137 mmol/L (ref 135–145)
Total Bilirubin: 0.8 mg/dL (ref 0.3–1.2)
Total Protein: 8 g/dL (ref 6.5–8.1)

## 2021-04-23 LAB — LIPASE, BLOOD: Lipase: 32 U/L (ref 11–51)

## 2021-04-23 LAB — GLUCOSE, POCT (MANUAL RESULT ENTRY): POC Glucose: 315 mg/dl — AB (ref 70–99)

## 2021-04-23 LAB — CBG MONITORING, ED: Glucose-Capillary: 177 mg/dL — ABNORMAL HIGH (ref 70–99)

## 2021-04-23 MED ORDER — DIPHENHYDRAMINE HCL 50 MG/ML IJ SOLN
25.0000 mg | Freq: Once | INTRAMUSCULAR | Status: AC
  Administered 2021-04-23: 25 mg via INTRAVENOUS
  Filled 2021-04-23: qty 1

## 2021-04-23 MED ORDER — SODIUM CHLORIDE 0.9 % IV BOLUS
1000.0000 mL | Freq: Once | INTRAVENOUS | Status: AC
  Administered 2021-04-23: 1000 mL via INTRAVENOUS

## 2021-04-23 MED ORDER — FAMOTIDINE IN NACL 20-0.9 MG/50ML-% IV SOLN
20.0000 mg | Freq: Once | INTRAVENOUS | Status: AC
  Administered 2021-04-23: 20 mg via INTRAVENOUS
  Filled 2021-04-23: qty 50

## 2021-04-23 MED ORDER — ONDANSETRON HCL 4 MG/2ML IJ SOLN
4.0000 mg | Freq: Once | INTRAMUSCULAR | Status: AC
  Administered 2021-04-23: 4 mg via INTRAVENOUS
  Filled 2021-04-23: qty 2

## 2021-04-23 MED ORDER — PROCHLORPERAZINE EDISYLATE 10 MG/2ML IJ SOLN
10.0000 mg | Freq: Once | INTRAMUSCULAR | Status: AC
  Administered 2021-04-23: 10 mg via INTRAVENOUS
  Filled 2021-04-23: qty 2

## 2021-04-23 MED ORDER — LORAZEPAM 2 MG/ML IJ SOLN
1.0000 mg | Freq: Once | INTRAMUSCULAR | Status: AC
  Administered 2021-04-23: 1 mg via INTRAVENOUS
  Filled 2021-04-23: qty 1

## 2021-04-23 MED ORDER — KETOROLAC TROMETHAMINE 30 MG/ML IJ SOLN
30.0000 mg | Freq: Once | INTRAMUSCULAR | Status: AC
  Administered 2021-04-23: 30 mg via INTRAVENOUS
  Filled 2021-04-23: qty 1

## 2021-04-23 MED ORDER — FENTANYL CITRATE (PF) 100 MCG/2ML IJ SOLN
100.0000 ug | Freq: Once | INTRAMUSCULAR | Status: AC
Start: 2021-04-23 — End: 2021-04-23
  Administered 2021-04-23: 100 ug via INTRAVENOUS
  Filled 2021-04-23: qty 2

## 2021-04-23 NOTE — ED Notes (Signed)
Pt made aware of need for urine sample. Pt states she doesn't need to go right now.

## 2021-04-23 NOTE — ED Notes (Signed)
Pt vomited. MD aware and nausea meds ordered

## 2021-04-23 NOTE — ED Notes (Signed)
Pt c/o headache and blurry vision. Dr. Clarene Duke notified

## 2021-04-23 NOTE — ED Provider Notes (Signed)
23:30: Assumed care of patient from Dr. Clarene Duke at change of shift pending CT abdomen/pelvis and disposition.  Please see prior provider note for full H&P.  Briefly patient is a 45 year old female who underwent cholecystectomy 04/08/2021, return to the hospital 05/16 for nausea, vomiting, and abdominal pain, was found to have colitis as well as a left rectus sheath hematoma and was admitted, antibiotics were initiated and then discontinued, patient was ultimately able to be discharged.  She was doing okay the day of discharge and the following, however today she woke up with nausea, vomiting, and abdominal pain.  Plan at change of shift is to follow-up on CT scan and disposition appropriately.  Physical Exam  BP (!) 170/99   Pulse 81   Temp 99 F (37.2 C) (Oral)   Resp 17   LMP  (LMP Unknown) Comment: neg test  SpO2 98%   Physical Exam Constitutional:      General: She is not in acute distress.    Appearance: She is not toxic-appearing.     Comments: Sleeping on evaluation, alert once awakened.   Abdominal:     General: Bowel sounds are normal.     Tenderness: There is abdominal tenderness (mild) in the left upper quadrant and left lower quadrant. There is no guarding or rebound.     ED Course/Procedures     Results for orders placed or performed during the hospital encounter of 04/23/21  Comprehensive metabolic panel  Result Value Ref Range   Sodium 137 135 - 145 mmol/L   Potassium 3.9 3.5 - 5.1 mmol/L   Chloride 102 98 - 111 mmol/L   CO2 27 22 - 32 mmol/L   Glucose, Bld 204 (H) 70 - 99 mg/dL   BUN 5 (L) 6 - 20 mg/dL   Creatinine, Ser 1.02 0.44 - 1.00 mg/dL   Calcium 9.2 8.9 - 72.5 mg/dL   Total Protein 8.0 6.5 - 8.1 g/dL   Albumin 4.1 3.5 - 5.0 g/dL   AST 37 15 - 41 U/L   ALT 36 0 - 44 U/L   Alkaline Phosphatase 74 38 - 126 U/L   Total Bilirubin 0.8 0.3 - 1.2 mg/dL   GFR, Estimated >36 >64 mL/min   Anion gap 8 5 - 15  Lipase, blood  Result Value Ref Range   Lipase 32  11 - 51 U/L  CBC with Differential  Result Value Ref Range   WBC 10.6 (H) 4.0 - 10.5 K/uL   RBC 4.55 3.87 - 5.11 MIL/uL   Hemoglobin 14.1 12.0 - 15.0 g/dL   HCT 40.3 47.4 - 25.9 %   MCV 93.0 80.0 - 100.0 fL   MCH 31.0 26.0 - 34.0 pg   MCHC 33.3 30.0 - 36.0 g/dL   RDW 56.3 87.5 - 64.3 %   Platelets 329 150 - 400 K/uL   nRBC 0.0 0.0 - 0.2 %   Neutrophils Relative % 66 %   Neutro Abs 6.8 1.7 - 7.7 K/uL   Lymphocytes Relative 27 %   Lymphs Abs 2.9 0.7 - 4.0 K/uL   Monocytes Relative 6 %   Monocytes Absolute 0.6 0.1 - 1.0 K/uL   Eosinophils Relative 1 %   Eosinophils Absolute 0.1 0.0 - 0.5 K/uL   Basophils Relative 0 %   Basophils Absolute 0.0 0.0 - 0.1 K/uL   Immature Granulocytes 0 %   Abs Immature Granulocytes 0.04 0.00 - 0.07 K/uL  Urinalysis, Routine w reflex microscopic Urine, Clean Catch  Result Value Ref  Range   Color, Urine YELLOW YELLOW   APPearance CLOUDY (A) CLEAR   Specific Gravity, Urine 1.009 1.005 - 1.030   pH 9.0 (H) 5.0 - 8.0   Glucose, UA >=500 (A) NEGATIVE mg/dL   Hgb urine dipstick NEGATIVE NEGATIVE   Bilirubin Urine NEGATIVE NEGATIVE   Ketones, ur 20 (A) NEGATIVE mg/dL   Protein, ur NEGATIVE NEGATIVE mg/dL   Nitrite NEGATIVE NEGATIVE   Leukocytes,Ua MODERATE (A) NEGATIVE   RBC / HPF 0-5 0 - 5 RBC/hpf   WBC, UA 11-20 0 - 5 WBC/hpf   Bacteria, UA RARE (A) NONE SEEN   Squamous Epithelial / LPF 11-20 0 - 5   Mucus PRESENT   CBG monitoring, ED  Result Value Ref Range   Glucose-Capillary 177 (H) 70 - 99 mg/dL   CT Abdomen Pelvis Wo Contrast  Result Date: 04/23/2021 CLINICAL DATA:  Left-sided abdominal pain. EXAM: CT ABDOMEN AND PELVIS WITHOUT CONTRAST TECHNIQUE: Multidetector CT imaging of the abdomen and pelvis was performed following the standard protocol without IV contrast. COMPARISON:  Apr 20, 2021 FINDINGS: Lower chest: Mild atelectasis is seen within the right middle lobe. Hepatobiliary: No focal liver abnormality is seen. Status post  cholecystectomy. No biliary dilatation. Pancreas: Unremarkable. No pancreatic ductal dilatation or surrounding inflammatory changes. Spleen: Normal in size without focal abnormality. Adrenals/Urinary Tract: Adrenal glands are unremarkable. Kidneys are normal, without renal calculi, focal lesion, or hydronephrosis. Bladder is unremarkable. Stomach/Bowel: Stomach is within normal limits. Appendix appears normal. No evidence of bowel wall thickening, distention, or inflammatory changes. Vascular/Lymphatic: Mild aortic atherosclerosis. No enlarged abdominal or pelvic lymph nodes. Reproductive: The uterus is normal in size and appearance. Bilateral tubal ligation clips are noted Other: A stable 5.2 cm x 3.3 cm area of asymmetric thickening of the left rectus abdominus musculature is seen. This contains a 3.3 cm x 2.4 cm well-defined, cystic appearing area. A mild-to-moderate amount of nonhemorrhagic posterior pelvic free fluid is noted. This is increased in severity when compared to the prior study. Musculoskeletal: No acute or significant osseous findings. IMPRESSION: 1. Findings suggestive of an evolving left rectus sheath hematoma which is stable in appearance when compared to the prior study. MRI correlation is recommended. 2. Evidence of prior cholecystectomy. 3. Mild to moderate amount of nonhemorrhagic posterior pelvic free fluid. Electronically Signed   By: Aram Candelahaddeus  Houston M.D.   On: 04/23/2021 23:33   CT ABDOMEN PELVIS WO CONTRAST  Result Date: 04/20/2021 CLINICAL DATA:  Left lower quadrant abdominal pain. EXAM: CT ABDOMEN AND PELVIS WITHOUT CONTRAST TECHNIQUE: Multidetector CT imaging of the abdomen and pelvis was performed following the standard protocol without IV contrast. COMPARISON:  04/07/2021 FINDINGS: Lower chest: No acute abnormality. Hepatobiliary: Status post cholecystectomy. There is a small volume fluid within the gallbladder fossa adjacent to cholecystectomy clips, nonspecific and likely  postoperative. No signs of bile duct dilatation. No focal liver abnormality. Pancreas: Unremarkable. No pancreatic ductal dilatation or surrounding inflammatory changes. Spleen: Normal in size without focal abnormality. Adrenals/Urinary Tract: Normal adrenal glands. No kidney stone or hydronephrosis. Bladder is unremarkable. Stomach/Bowel: Stomach is nondistended. The appendix is visualized and appears normal. No small bowel wall thickening, inflammation or distension. There is circumferential wall thickening involving the: From the level of the ileocecal valve to the mid descending colon with pericolonic soft tissue stranding compatible with colitis. No signs of bowel obstruction, pneumatosis or bowel perforation. Vascular/Lymphatic: Aortic atherosclerosis. No enlarged abdominal or pelvic lymph nodes. Reproductive: Uterus and bilateral adnexa are unremarkable. Other: There is a  trace amount of free fluid within the dependent portion of the gallbladder. Within the limitations of unenhanced technique there is no focal loculated fluid collections to suggest drainable abscess. Musculoskeletal: There is abnormal asymmetric thickening of the left rectus abdominus musculature which measures 5.2 x 3.2 by 14.6 cm (volume = 130 cm^3). This is compatible with rectus sheath hematoma, image 61/2. IMPRESSION: 1. Examination is positive for circumferential wall thickening involving the colon from the level of the ileocecal valve to the mid descending colon compatible with colitis. No signs of bowel obstruction, pneumatosis or bowel perforation. 2. Interval development of left rectus sheath hematoma with a volume of approximately 130 cc. 3. Status post cholecystectomy. Small volume fluid within the gallbladder fossa is nonspecific and compatible with postoperative change. 4. Aortic atherosclerosis. Aortic Atherosclerosis (ICD10-I70.0). Electronically Signed   By: Signa Kell M.D.   On: 04/20/2021 18:38   CT Abdomen Pelvis W  Contrast  Result Date: 04/07/2021 CLINICAL DATA:  Left upper quadrant abdominal pain EXAM: CT ABDOMEN AND PELVIS WITH CONTRAST TECHNIQUE: Multidetector CT imaging of the abdomen and pelvis was performed using the standard protocol following bolus administration of intravenous contrast. CONTRAST:  OMNIPAQUE IOHEXOL 300 MG/ML SOLN, <See Chart> OMNIPAQUE IOHEXOL 300 MG/ML SOLN COMPARISON:  Abdominal ultrasound 04/07/2021 FINDINGS: The patient was apparently unable to raise the right arm during imaging, resulting in mild reduction of signal to noise ratio. Lower chest: Linear subsegmental atelectasis or scarring in the right middle lobe and posterior basal segment of the left lower lobe. Mild distal esophageal wall thickening, esophagitis would be the most common cause. Hepatobiliary: Multiple gallstones in the gallbladder likely with sludge in the gallbladder as well. No pericholecystic fluid identified. Focal steatosis in segment 4b of the liver adjacent to the falciform ligament. Pancreas: Unremarkable Spleen: Unremarkable Adrenals/Urinary Tract: Unremarkable Stomach/Bowel: Unremarkable.  Normal appendix.  No dilated bowel. Vascular/Lymphatic: Aortoiliac atherosclerotic vascular disease. No pathologic adenopathy. Reproductive: Unremarkable Other: No supplemental non-categorized findings. Musculoskeletal: Unremarkable IMPRESSION: 1. Cholelithiasis. 2. Wall thickening in the distal esophagus, esophagitis would be the most common cause. 3.  Aortic Atherosclerosis (ICD10-I70.0). 4. No specific left upper quadrant finding to fully explain the patient's abdominal pain. Electronically Signed   By: Gaylyn Rong M.D.   On: 04/07/2021 20:22   US Abdomen Limited RUQ (LIVER/GB)  Result Date: 04/07/2021 CLINICAL DATA:  Abdominal pain. EXAM: ULTRASOUND ABDOMEN LIMITED RIGHT UPPER QUADRANT COMPARISON:  Ultrasound, 03/08/2021. FINDINGS: Gallbladder: Gallbladder is filled with stones and sludge. No wall thickening or  pericholecystic fluid. Positive sonographic Murphy's sign. Common bile duct: Diameter: 4 mm. Liver: No focal lesion identified. Within normal limits in parenchymal echogenicity. Portal vein is patent on color Doppler imaging with normal direction of blood flow towards the liver. Other: None. IMPRESSION: 1. Gallbladder filled with sludge and stones with a positive sonographic Murphy sign, but no wall thickening or pericholecystic fluid. This appearance is stable from the prior study. No convincing acute cholecystitis. 2. Normal liver.  No bile duct dilation. Electronically Signed   By: Amie Portland M.D.   On: 04/07/2021 16:07     Procedures  MDM  Labs reviewed. Urine with moderate leukocytes, some contamination present, no urinary sxs, has been sent for culture.   CT A/P 1. Findings suggestive of an evolving left rectus sheath hematoma which is stable in appearance when compared to the prior study. MRI correlation is recommended. 2. Evidence of prior cholecystectomy. 3. Mild to moderate amount of nonhemorrhagic posterior pelvic free fluid  Discussed CT findings  w/ Dr. Clarene Duke as these resulted @ change of shift, plan to PO challenge & discharge home if able to tolerate PO.    On re-evaluation of patient @ 23:45 patient remains with some mild discomfort, feels somewhat nauseated still, will give ativan & pepcid with plan for PO challenge.   01:05: RE-EVAL: Patient woken from sleep, resting comfortably, able to tolerate PO, will discharge home at this time. General surgery & PCP follow up. I discussed results, treatment plan, need for follow-up, and return precautions with the patient. Provided opportunity for questions, patient confirmed understanding and is in agreement with plan.   Findings and plan of care discussed with supervising physician Dr. Pilar Plate @ change of shift who is in agreement.      Cherly Anderson, PA-C 04/24/21 0115    Sabas Sous, MD 04/24/21 [phone]

## 2021-04-23 NOTE — ED Triage Notes (Signed)
Coming from home, LLQ pain radiating around to left flank, BGL 197, had recent cholecystectomy, intermittent pain for 2 months

## 2021-04-23 NOTE — ED Provider Notes (Signed)
[facility] DEPT Provider Note   CSN: [id number] Arrival date & time: 04/23/21  1630     History Chief Complaint  Patient presents with  . Abdominal Pain    Cathalina R Stegall is a 45 y.o. female.  45yo F w/ PMH including recent cholecystectomy, GERD, T2DM who p/w abdominal pain.  Patient recently had a cholecystectomy and then came back to the hospital and was admitted 5/16 through 5/17 for abdominal pain and vomiting thought to be possible colitis.  When she was discharged home, she was feeling better and states that she had a normal day yesterday.  This morning, she began having severe abdominal pain again, left-sided and radiating to her left flank, associated with persistent nausea and vomiting.  Her blood glucose has been doing okay at home staying in the 100s.  This is similar pain to what she had previously when she was admitted.  She has had 2 normal bowel movements today, no diarrhea or blood.  No urinary symptoms, vaginal bleeding/discharge, fevers, or sick contacts.  Pain is currently severe.  She uses marijuana occasionally but not daily.  She denies any alcohol or NSAID use.  The history is provided by the patient.  Abdominal Pain      Past Medical History:  Diagnosis Date  . Acute cholecystitis s/p lap cholecystectomy 04/08/2021 04/07/2021  . Cholelithiasis   . Diabetes mellitus type 2 in nonobese ([place]) 04/20/2021  . GERD (gastroesophageal reflux disease)   . Tobacco abuse     Patient Active Problem List   Diagnosis Date Noted  . DM (diabetes mellitus), type 2, uncontrolled ([place]) 04/21/2021  . Colitis 04/20/2021  . Abdominal pain 04/08/2021  . Hypokalemia 04/08/2021  . Hyperglycemia 04/08/2021  . Dehydration 04/08/2021  . GERD (gastroesophageal reflux disease)   . Tobacco abuse   . Nausea & vomiting   . Acute cholecystitis s/p lap cholecystectomy 04/08/2021 04/07/2021    Past Surgical History:  Procedure Laterality Date  . CESAREAN  SECTION    . CHOLECYSTECTOMY N/A 04/08/2021   Procedure: LAPAROSCOPIC CHOLECYSTECTOMy;  Surgeon: Coralie Keens, MD;  Location: [facility];  Service: General;  Laterality: N/A;     OB History   No obstetric history on file.     Family History  Problem Relation Age of Onset  . Hypertension Mother     Social History   Tobacco Use  . Smoking status: Current Every Day Smoker    Packs/day: 1.50  . Smokeless tobacco: Never Used  Vaping Use  . Vaping Use: Never used  Substance Use Topics  . Alcohol use: Yes    Comment: socially  . Drug use: Yes    Types: Marijuana    Home Medications Prior to Admission medications   Medication Sig Start Date End Date Taking? Authorizing Provider  blood glucose meter kit and supplies KIT Dispense based on patient and insurance preference. Use up to four times daily as directed. 04/10/21   Christell Steinmiller Ishikawa, MD  famotidine (PEPCID) 20 MG tablet Take 1 tablet (20 mg total) by mouth 2 (two) times daily. 01/09/20   Jaynee Eagles, PA-C  insulin isophane & regular human (NOVOLIN 70/30 FLEXPEN RELION) (70-30) 100 UNIT/ML KwikPen Inject 8 Units into the skin 2 (two) times daily. 04/10/21   Myranda Pavone Ishikawa, MD  Insulin Pen Needle (PEN NEEDLES) 32G X 4 MM MISC 1 each by Does not apply route daily. 04/10/21 05/10/21  Kirstein Baxley Ishikawa, MD  metFORMIN (GLUCOPHAGE) 1000 MG tablet Take 1  tablet (1,000 mg total) by mouth 2 (two) times daily with a meal. 04/22/21   Kerin Perna, NP  ondansetron (ZOFRAN ODT) 4 MG disintegrating tablet Take 1 tablet (4 mg total) by mouth every 8 (eight) hours as needed for nausea or vomiting. 04/20/21   Karel Jarvis, Druscilla Brownie, PA-C  ondansetron (ZOFRAN) 4 MG tablet Take 1 tablet (4 mg total) by mouth daily as needed for nausea or vomiting. 04/10/21 04/10/22  Maicie Vanderloop Ishikawa, MD  sitaGLIPtin (JANUVIA) 100 MG tablet Take 1 tablet (100 mg total) by mouth daily. 04/22/21   Kerin Perna, NP    Allergies    Patient has no known  allergies.  Review of Systems   Review of Systems  Gastrointestinal: Positive for abdominal pain.   All other systems reviewed and are negative except that which was mentioned in HPI  Physical Exam Updated Vital Signs BP (!) 170/99   Pulse 81   Temp 99 F (37.2 C) (Oral)   Resp 17   LMP  (LMP Unknown) Comment: neg test  SpO2 98%   Physical Exam Constitutional:      General: She is not in acute distress.    Appearance: Normal appearance.     Comments: Very uncomfortable, laying on R side  HENT:     Head: Normocephalic and atraumatic.  Eyes:     Conjunctiva/sclera: Conjunctivae normal.  Cardiovascular:     Rate and Rhythm: Normal rate and regular rhythm.     Heart sounds: Normal heart sounds. No murmur heard.   Pulmonary:     Effort: Pulmonary effort is normal.     Breath sounds: Normal breath sounds.  Abdominal:     General: Abdomen is flat. Bowel sounds are normal. There is no distension.     Palpations: Abdomen is soft.     Tenderness: There is abdominal tenderness.     Comments: Left upper and left mid-abdominal pain, no rebound or guarding  Musculoskeletal:     Right lower leg: No edema.     Left lower leg: No edema.  Skin:    General: Skin is warm and dry.  Neurological:     Mental Status: She is alert and oriented to person, place, and time.     Comments: fluent  Psychiatric:        Mood and Affect: Mood is anxious.        Behavior: Behavior normal.     ED Results / Procedures / Treatments   Labs (all labs ordered are listed, but only abnormal results are displayed) Labs Reviewed  COMPREHENSIVE METABOLIC PANEL - Abnormal; Notable for the following components:      Result Value   Glucose, Bld 204 (*)    BUN 5 (*)    All other components within normal limits  CBC WITH DIFFERENTIAL/PLATELET - Abnormal; Notable for the following components:   WBC 10.6 (*)    All other components within normal limits  URINALYSIS, ROUTINE W REFLEX MICROSCOPIC -  Abnormal; Notable for the following components:   APPearance CLOUDY (*)    pH 9.0 (*)    Glucose, UA >=500 (*)    Ketones, ur 20 (*)    Leukocytes,Ua MODERATE (*)    Bacteria, UA RARE (*)    All other components within normal limits  CBG MONITORING, ED - Abnormal; Notable for the following components:   Glucose-Capillary 177 (*)    All other components within normal limits  URINE CULTURE  LIPASE, BLOOD  POC URINE PREG,  ED    EKG None  Radiology No results found.  Procedures Procedures   Medications Ordered in ED Medications  sodium chloride 0.9 % bolus 1,000 mL (0 mLs Intravenous Stopped 04/23/21 1815)  ondansetron (ZOFRAN) injection 4 mg (4 mg Intravenous Given 04/23/21 1730)  fentaNYL (SUBLIMAZE) injection 100 mcg (100 mcg Intravenous Given 04/23/21 1731)  ondansetron (ZOFRAN) injection 4 mg (4 mg Intravenous Given 04/23/21 2021)  diphenhydrAMINE (BENADRYL) injection 25 mg (25 mg Intravenous Given 04/23/21 2032)  prochlorperazine (COMPAZINE) injection 10 mg (10 mg Intravenous Given 04/23/21 2031)  ketorolac (TORADOL) 30 MG/ML injection 30 mg (30 mg Intravenous Given 04/23/21 2032)    ED Course  I have reviewed the triage vital signs and the nursing notes.  Pertinent labs & imaging results that were available during my care of the patient were reviewed by me and considered in my medical decision making (see chart for details).    MDM Rules/Calculators/A&P                          Uncomfortable but nontoxic on exam, stable vital signs.  Left-sided tenderness noted.  Labs show blood glucose 204, normal anion gap, normal LFTs and lipase, WBC 10.6.UA w/ moderate leuks but some squamous cells; she is asymptomatic thus added culture and will hold off on treatment. Because of ongoing pain and recent abd surgery, I have ordered CT abd/pelvis for evaluation of post-op infection or obstruction. ddx also includes kidney stone. PT later had a headache, gave migraine cocktail. PT signed out  to overnight provider pending CT results and PO challenge. Final Clinical Impression(s) / ED Diagnoses Final diagnoses:  None    Rx / DC Orders ED Discharge Orders    None       Mariaeduarda Defranco, Wenda Overland, MD 04/23/21 2316

## 2021-04-24 MED ORDER — PROMETHAZINE HCL 25 MG PO TABS: 25.0000 mg | ORAL_TABLET | Freq: Three times a day (TID) | ORAL | 0 refills | Status: DC | PRN

## 2021-04-24 NOTE — ED Notes (Signed)
Pt tolerated PO challenge. No vomiting after drinking gingerale

## 2021-04-24 NOTE — ED Notes (Signed)
Pt given gingerale for PO challenge 

## 2021-04-24 NOTE — Discharge Instructions (Addendum)
You were seen in the ER today for nausea, vomiting, & abdominal pain. Your labs were overall reassuring. Your blood sugar was mildly high and your blood pressure has been elevated - please have these rechecked by primary care. Your CT scan showed your hematoma that was seen on prior imaging- please discuss this with your general surgeon.   We are sending you home with phenergan to take every 8 hours as needed for nausea/vomiting if the zofran you are prescribed is not helping.   We have prescribed you new medication(s) today. Discuss the medications prescribed today with your pharmacist as they can have adverse effects and interactions with your other medicines including over the counter and prescribed medications. Seek medical evaluation if you start to experience new or abnormal symptoms after taking one of these medicines, seek care immediately if you start to experience difficulty breathing, feeling of your throat closing, facial swelling, or rash as these could be indications of a more serious allergic reaction  Please follow up with primary care and your general surgeon as soon as possible. Return to the Er for new or worsening symptoms including but not limited to new or worsening pain, inability to keep fluids down, fever, blood in stool, vomit, or urine, passing out, chest pain, trouble breathing, or any other concerns.

## 2021-04-25 LAB — URINE CULTURE

## 2021-04-29 ENCOUNTER — Other Ambulatory Visit: Payer: Self-pay

## 2021-05-19 NOTE — Progress Notes (Unsigned)
    S:     PCP: Gwinda Passe  Patient arrives ***.  Presents for diabetes evaluation, education, and management Patient was referred and last seen by Primary Care Provider on 04/22/21. At that visit, pt's POCT BG 315 and was started on metformin 1000 mg BID and Januvia 100 mg daily, and continued on Novolin 70/30 8 units BID.    Today, patient reports ***  CC: abdominal pain/nausea/vomiting/constipation - s/p cholecystectomy 04/08/2021 found to have colitis as well as a left rectus sheath hematoma and was admitted, antibiotics were initiated    A1C = 11.1% Check Clinic BG Review medications and adherence (timing of meds, etc.)  Ate or drank anything prior to visit today? At home BGs?  Highs Lows  Hyperglycemia sx (nocturia, neuropathy, visual changes, foot exams) Hypoglycemia symptoms (dizziness, shaky, sweating, hungry, confusion) Diet Exercise  Plan: Scale back metformin and switch to XR - slow titration given GI issues Switch Novolin to [employer] - states medicaid "potential" Sitagliptin to GLP eventually once GI issues stable/resolve Lipid panel UACR   Patient reports Diabetes was newly diagnosed in May 2022.   Family/Social History:  -Fhx: HTN -Tobacco use: current tobacco smoker - 1.5 ppd  Insurance coverage/medication affordability: Medicaid "Potential"  Medication adherence reported *** .   Current diabetes medications include: Novolin 70/30 8 units BID Current hypertension medications include: none Current hyperlipidemia medications include: none  Patient {Actions; denies-reports:120008} hypoglycemic events.  Patient reported dietary habits: Eats *** meals/day Breakfast:*** Lunch:*** Dinner:*** Snacks:*** Drinks:***  Patient-reported exercise habits: ***   Patient {Actions; denies-reports:120008} nocturia (nighttime urination).  Patient {Actions; denies-reports:120008} neuropathy (nerve pain). Patient {Actions; denies-reports:120008} visual  changes. Patient {Actions; denies-reports:120008} self foot exams.     O:  POCT:  Home fasting blood sugars: ***  2 hour post-meal/random blood sugars: ***.  Lab Results  Component Value Date   HGBA1C 11.1 (H) 04/07/2021   There were no vitals filed for this visit.  Lipid Panel  No results found for: CHOL, TRIG, HDL, CHOLHDL, VLDL, LDLCALC, LDLDIRECT  Clinical Atherosclerotic Cardiovascular Disease (ASCVD): No  The ASCVD Risk score Denman George DC Jr., et al., 2013) failed to calculate for the following reasons:   Cannot find a previous HDL lab   Cannot find a previous total cholesterol lab   A/P: Diabetes newly diagnosed currently uncontrolled. Patient is *** able to verbalize appropriate hypoglycemia management plan. Medication adherence appears ***. Control is suboptimal due to ***. - -Extensively discussed pathophysiology of diabetes, recommended lifestyle interventions, dietary effects on blood sugar control -Counseled on s/sx of and management of hypoglycemia -Next A1C anticipated ***.   ASCVD risk - primary prevention in patient with diabetes. Last LDL  not on file . Pt currently not on a statin. -Recommend checking lipid panel   Written patient instructions provided.  Total time in face to face counseling *** minutes.   Follow up Pharmacist/PCP*** Clinic Visit in ***.    Fabio Neighbors, PharmD, BCPS PGY2 Ambulatory Care Resident [facility]  Pharmacy

## 2021-05-20 ENCOUNTER — Emergency Department (HOSPITAL_COMMUNITY)
Admission: EM | Admit: 2021-05-20 | Discharge: 2021-05-20 | Disposition: A | Discharge status: Home / Self Care | Payer: Medicaid Other | Attending: Emergency Medicine | Admitting: Emergency Medicine

## 2021-05-20 ENCOUNTER — Ambulatory Visit: Payer: Medicaid Other | Admitting: Pharmacist

## 2021-05-20 ENCOUNTER — Encounter (HOSPITAL_COMMUNITY): Payer: Self-pay | Admitting: Emergency Medicine

## 2021-05-20 ENCOUNTER — Emergency Department (HOSPITAL_COMMUNITY): Payer: Medicaid Other

## 2021-05-20 DIAGNOSIS — F17210 Nicotine dependence, cigarettes, uncomplicated: Secondary | ICD-10-CM | POA: Insufficient documentation

## 2021-05-20 DIAGNOSIS — R1110 Vomiting, unspecified: Secondary | ICD-10-CM | POA: Diagnosis present

## 2021-05-20 DIAGNOSIS — I10 Essential (primary) hypertension: Secondary | ICD-10-CM | POA: Diagnosis not present

## 2021-05-20 DIAGNOSIS — R1111 Vomiting without nausea: Secondary | ICD-10-CM | POA: Diagnosis not present

## 2021-05-20 DIAGNOSIS — R109 Unspecified abdominal pain: Secondary | ICD-10-CM | POA: Diagnosis not present

## 2021-05-20 DIAGNOSIS — K2970 Gastritis, unspecified, without bleeding: Secondary | ICD-10-CM | POA: Diagnosis not present

## 2021-05-20 DIAGNOSIS — Z794 Long term (current) use of insulin: Secondary | ICD-10-CM | POA: Diagnosis not present

## 2021-05-20 DIAGNOSIS — E119 Type 2 diabetes mellitus without complications: Secondary | ICD-10-CM | POA: Insufficient documentation

## 2021-05-20 DIAGNOSIS — R110 Nausea: Secondary | ICD-10-CM | POA: Diagnosis not present

## 2021-05-20 DIAGNOSIS — Z7984 Long term (current) use of oral hypoglycemic drugs: Secondary | ICD-10-CM | POA: Insufficient documentation

## 2021-05-20 DIAGNOSIS — R112 Nausea with vomiting, unspecified: Secondary | ICD-10-CM

## 2021-05-20 LAB — CBC WITH DIFFERENTIAL/PLATELET
Abs Immature Granulocytes: 0.05 10*3/uL (ref 0.00–0.07)
Basophils Absolute: 0 10*3/uL (ref 0.0–0.1)
Basophils Relative: 0 %
Eosinophils Absolute: 0.1 10*3/uL (ref 0.0–0.5)
Eosinophils Relative: 1 %
HCT: 41 % (ref 36.0–46.0)
Hemoglobin: 13.3 g/dL (ref 12.0–15.0)
Immature Granulocytes: 1 %
Lymphocytes Relative: 28 %
Lymphs Abs: 2.6 10*3/uL (ref 0.7–4.0)
MCH: 31.4 pg (ref 26.0–34.0)
MCHC: 32.4 g/dL (ref 30.0–36.0)
MCV: 96.7 fL (ref 80.0–100.0)
Monocytes Absolute: 0.4 10*3/uL (ref 0.1–1.0)
Monocytes Relative: 4 %
Neutro Abs: 5.9 10*3/uL (ref 1.7–7.7)
Neutrophils Relative %: 66 %
Platelets: 194 10*3/uL (ref 150–400)
RBC: 4.24 MIL/uL (ref 3.87–5.11)
RDW: 12.4 % (ref 11.5–15.5)
WBC: 9 10*3/uL (ref 4.0–10.5)
nRBC: 0 % (ref 0.0–0.2)

## 2021-05-20 LAB — COMPREHENSIVE METABOLIC PANEL
ALT: 26 U/L (ref 0–44)
AST: 32 U/L (ref 15–41)
Albumin: 4 g/dL (ref 3.5–5.0)
Alkaline Phosphatase: 77 U/L (ref 38–126)
Anion gap: 8 (ref 5–15)
BUN: 11 mg/dL (ref 6–20)
CO2: 25 mmol/L (ref 22–32)
Calcium: 9 mg/dL (ref 8.9–10.3)
Chloride: 104 mmol/L (ref 98–111)
Creatinine, Ser: 0.76 mg/dL (ref 0.44–1.00)
GFR, Estimated: >60 mL/min (ref >60)
Glucose, Bld: 250 mg/dL — ABNORMAL HIGH (ref 70–99)
Potassium: 4.1 mmol/L (ref 3.5–5.1)
Sodium: 137 mmol/L (ref 135–145)
Total Bilirubin: 0.9 mg/dL (ref 0.3–1.2)
Total Protein: 8.3 g/dL — ABNORMAL HIGH (ref 6.5–8.1)

## 2021-05-20 LAB — LIPASE, BLOOD: Lipase: 30 U/L (ref 11–51)

## 2021-05-20 LAB — I-STAT BETA HCG BLOOD, ED (MC, WL, AP ONLY): I-stat hCG, quantitative: <5 m[IU]/mL (ref <5)

## 2021-05-20 MED ORDER — ONDANSETRON HCL 4 MG/2ML IJ SOLN
4.0000 mg | Freq: Once | INTRAMUSCULAR | Status: AC
  Administered 2021-05-20: 4 mg via INTRAVENOUS
  Filled 2021-05-20: qty 2

## 2021-05-20 MED ORDER — HYDROMORPHONE HCL 1 MG/ML IJ SOLN
1.0000 mg | Freq: Once | INTRAMUSCULAR | Status: AC
  Administered 2021-05-20: 1 mg via INTRAVENOUS
  Filled 2021-05-20: qty 1

## 2021-05-20 MED ORDER — PANTOPRAZOLE SODIUM 40 MG IV SOLR
40.0000 mg | Freq: Once | INTRAVENOUS | Status: AC
  Administered 2021-05-20: 40 mg via INTRAVENOUS
  Filled 2021-05-20: qty 40

## 2021-05-20 MED ORDER — SODIUM CHLORIDE 0.9 % IV BOLUS
1000.0000 mL | Freq: Once | INTRAVENOUS | Status: AC
  Administered 2021-05-20: 1000 mL via INTRAVENOUS

## 2021-05-20 MED ORDER — HYDROCODONE-ACETAMINOPHEN 5-325 MG PO TABS: 1.0000 | ORAL_TABLET | Freq: Four times a day (QID) | ORAL | 0 refills | Status: DC | PRN

## 2021-05-20 MED ORDER — PANTOPRAZOLE SODIUM 20 MG PO TBEC: 20.0000 mg | DELAYED_RELEASE_TABLET | Freq: Every day | ORAL | 0 refills | Status: DC

## 2021-05-20 MED ORDER — ONDANSETRON 4 MG PO TBDP: ORAL_TABLET | ORAL | 0 refills | Status: DC

## 2021-05-20 NOTE — ED Provider Notes (Signed)
[facility] DEPT Provider Note   CSN: [id number] Arrival date & time: 05/20/21  0920     History Chief Complaint  Patient presents with   Emesis    [name] is a 45 y.o. female.  Patient with abdominal pain and vomiting.  The history is provided by the patient and medical records. No language interpreter was used.  Emesis Severity:  Mild Duration: 1 day. Timing:  Intermittent Quality:  Undigested food Able to tolerate:  Liquids Progression:  Unchanged Chronicity:  New Recent urination:  Normal Relieved by:  Nothing Associated symptoms: abdominal pain   Associated symptoms: no cough, no diarrhea and no headaches       Past Medical History:  Diagnosis Date   Acute cholecystitis s/p lap cholecystectomy 04/08/2021 04/07/2021   Cholelithiasis    Diabetes mellitus type 2 in nonobese ([place]) 04/20/2021   GERD (gastroesophageal reflux disease)    Tobacco abuse     Patient Active Problem List   Diagnosis Date Noted   DM (diabetes mellitus), type 2, uncontrolled ([place]) 04/21/2021   Colitis 04/20/2021   Abdominal pain 04/08/2021   Hypokalemia 04/08/2021   Hyperglycemia 04/08/2021   Dehydration 04/08/2021   GERD (gastroesophageal reflux disease)    Tobacco abuse    Nausea & vomiting    Acute cholecystitis s/p lap cholecystectomy 04/08/2021 04/07/2021    Past Surgical History:  Procedure Laterality Date   CESAREAN SECTION     CHOLECYSTECTOMY N/A 04/08/2021   Procedure: LAPAROSCOPIC CHOLECYSTECTOMy;  Surgeon: Coralie Keens, MD;  Location: [facility];  Service: General;  Laterality: N/A;     OB History   No obstetric history on file.     Family History  Problem Relation Age of Onset   Hypertension Mother     Social History   Tobacco Use   Smoking status: Every Day    Packs/day: 1.50    Pack years: 0.00    Types: Cigarettes   Smokeless tobacco: Never  Vaping Use   Vaping Use: Never used  Substance Use Topics   Alcohol use:  Yes    Comment: socially   Drug use: Yes    Types: Marijuana    Home Medications Prior to Admission medications   Medication Sig Start Date End Date Taking? Authorizing Provider  HYDROcodone-acetaminophen (NORCO/VICODIN) 5-325 MG tablet Take 1 tablet by mouth every 6 (six) hours as needed for moderate pain. 05/20/21  Yes Milton Ferguson, MD  ondansetron (ZOFRAN ODT) 4 MG disintegrating tablet 39m ODT q4 hours prn nausea/vomit 05/20/21  Yes ZMilton Ferguson MD  pantoprazole (PROTONIX) 20 MG tablet Take 1 tablet (20 mg total) by mouth daily. 05/20/21  Yes ZMilton Ferguson MD  blood glucose meter kit and supplies KIT Dispense based on patient and insurance preference. Use up to four times daily as directed. 04/10/21   LLittle Ishikawa MD  famotidine (PEPCID) 20 MG tablet Take 1 tablet (20 mg total) by mouth 2 (two) times daily. 01/09/20   MJaynee Eagles PA-C  insulin isophane & regular human (NOVOLIN 70/30 FLEXPEN RELION) (70-30) 100 UNIT/ML KwikPen Inject 8 Units into the skin 2 (two) times daily. 04/10/21   LLittle Ishikawa MD  metFORMIN (GLUCOPHAGE) 1000 MG tablet Take 1 tablet (1,000 mg total) by mouth 2 (two) times daily with a meal. 04/22/21   EKerin Perna NP  ondansetron (ZOFRAN) 4 MG tablet Take 1 tablet (4 mg total) by mouth daily as needed for nausea or vomiting. 04/10/21 04/10/22  LLittle Ishikawa  MD  promethazine (PHENERGAN) 25 MG tablet Take 1 tablet (25 mg total) by mouth every 8 (eight) hours as needed for nausea or vomiting. 04/24/21   Petrucelli, Samantha R, PA-C  sitaGLIPtin (JANUVIA) 100 MG tablet Take 1 tablet (100 mg total) by mouth daily. 04/22/21   Kerin Perna, NP    Allergies    Patient has no known allergies.  Review of Systems   Review of Systems  Constitutional:  Negative for appetite change and fatigue.  HENT:  Negative for congestion, ear discharge and sinus pressure.   Eyes:  Negative for discharge.  Respiratory:  Negative for cough.    Cardiovascular:  Negative for chest pain.  Gastrointestinal:  Positive for abdominal pain and vomiting. Negative for diarrhea.  Genitourinary:  Negative for frequency and hematuria.  Musculoskeletal:  Negative for back pain.  Skin:  Negative for rash.  Neurological:  Negative for seizures and headaches.  Psychiatric/Behavioral:  Negative for hallucinations.    Physical Exam Updated Vital Signs BP (!) 149/88   Pulse 72   Temp (!) 97.5 F (36.4 C) (Oral)   Resp 18   Ht _0  (1.753 m)   Wt 68.9 kg   LMP  (LMP Unknown) Comment: neg test  SpO2 100%   BMI 22.45 kg/m   Physical Exam Vitals and nursing note reviewed.  Constitutional:      Appearance: She is well-developed.  HENT:     Head: Normocephalic.     Nose: Nose normal.  Eyes:     General: No scleral icterus.    Conjunctiva/sclera: Conjunctivae normal.  Neck:     Thyroid: No thyromegaly.  Cardiovascular:     Rate and Rhythm: Normal rate and regular rhythm.     Heart sounds: No murmur heard.   No friction rub. No gallop.  Pulmonary:     Breath sounds: No stridor. No wheezing or rales.  Chest:     Chest wall: No tenderness.  Abdominal:     General: There is no distension.     Tenderness: There is abdominal tenderness. There is no rebound.     Comments: Minimal epigastric tenderness  Musculoskeletal:        General: Normal range of motion.     Cervical back: Neck supple.  Lymphadenopathy:     Cervical: No cervical adenopathy.  Skin:    Findings: No erythema or rash.  Neurological:     Mental Status: She is alert and oriented to person, place, and time.     Motor: No abnormal muscle tone.     Coordination: Coordination normal.  Psychiatric:        Behavior: Behavior normal.    ED Results / Procedures / Treatments   Labs (all labs ordered are listed, but only abnormal results are displayed) Labs Reviewed  COMPREHENSIVE METABOLIC PANEL - Abnormal; Notable for the following components:      Result Value    Glucose, Bld 250 (*)    Total Protein 8.3 (*)    All other components within normal limits  CBC WITH DIFFERENTIAL/PLATELET  LIPASE, BLOOD  I-STAT BETA HCG BLOOD, ED ([facility], [facility], [facility] ONLY)    EKG None  Radiology DG ABD ACUTE 2+V W 1V CHEST  Result Date: 05/20/2021 CLINICAL DATA:  Nausea vomiting.  Abdominal pain EXAM: DG ABDOMEN ACUTE WITH 1 VIEW CHEST COMPARISON:  CT abdomen 04/23/2021 FINDINGS: There is no evidence of dilated bowel loops or free intraperitoneal air. No radiopaque calculi or other significant radiographic abnormality is seen.  Heart size and mediastinal contours are within normal limits. Mild atelectasis in the right lung base otherwise lungs are clear. Fallopian tube clips in the pelvis bilaterally. IMPRESSION: Negative abdominal radiographs.  No acute cardiopulmonary disease. Electronically Signed   By: Franchot Gallo M.D.   On: 05/20/2021 12:04    Procedures Procedures   Medications Ordered in ED Medications  sodium chloride 0.9 % bolus 1,000 mL (0 mLs Intravenous Stopped 05/20/21 1213)  pantoprazole (PROTONIX) injection 40 mg (40 mg Intravenous Given 05/20/21 1013)  ondansetron (ZOFRAN) injection 4 mg (4 mg Intravenous Given 05/20/21 1013)  HYDROmorphone (DILAUDID) injection 1 mg (1 mg Intravenous Given 05/20/21 1012)  ondansetron (ZOFRAN) injection 4 mg (4 mg Intravenous Given 05/20/21 1208)    ED Course  I have reviewed the triage vital signs and the nursing notes.  Pertinent labs & imaging results that were available during my care of the patient were reviewed by me and considered in my medical decision making (see chart for details).  Patient with abdominal pain and vomiting.  She improved with fluids and pain medicines.  X-ray unremarkable. MDM Rules/Calculators/A&P                          Patients with gastritis.  She is sent home with protonic Vicodin and Zofran and will follow up with her PCP Final Clinical Impression(s) / ED Diagnoses Final diagnoses:   Intractable vomiting with nausea, unspecified vomiting type    Rx / DC Orders ED Discharge Orders          Ordered    ondansetron (ZOFRAN ODT) 4 MG disintegrating tablet        05/20/21 1307    pantoprazole (PROTONIX) 20 MG tablet  Daily        05/20/21 1307    HYDROcodone-acetaminophen (NORCO/VICODIN) 5-325 MG tablet  Every 6 hours PRN        05/20/21 1307             Milton Ferguson, MD 05/25/21 1748

## 2021-05-20 NOTE — ED Triage Notes (Addendum)
Per EMS-states she has been constipated for 2 weeks-complaining of N/V since 6 am-has had same symptoms in past-history or SBO-having B/L back pain-CBG 200

## 2021-05-20 NOTE — ED Notes (Signed)
Pt ambulatory to bathroom, no assistance needed.  

## 2021-05-20 NOTE — Discharge Instructions (Addendum)
Drink plenty of fluids.  Follow-up with your family doctor next week for recheck.  Return if problem

## 2021-05-20 NOTE — ED Notes (Signed)
D/c paperwork reviewed with pt, including prescriptions. No questions or concerns at time of d/c. Ambulatory to ED exit.

## 2021-05-21 ENCOUNTER — Inpatient Hospital Stay: Payer: Medicaid Other | Admitting: Physician Assistant

## 2021-05-23 ENCOUNTER — Emergency Department (HOSPITAL_COMMUNITY): Payer: Medicaid Other

## 2021-05-23 ENCOUNTER — Emergency Department (HOSPITAL_COMMUNITY)
Admission: EM | Admit: 2021-05-23 | Discharge: 2021-05-23 | Disposition: A | Discharge status: Home / Self Care | Payer: Medicaid Other | Attending: Emergency Medicine | Admitting: Emergency Medicine

## 2021-05-23 ENCOUNTER — Encounter (HOSPITAL_COMMUNITY): Payer: Self-pay

## 2021-05-23 ENCOUNTER — Other Ambulatory Visit: Payer: Self-pay

## 2021-05-23 DIAGNOSIS — R109 Unspecified abdominal pain: Secondary | ICD-10-CM

## 2021-05-23 DIAGNOSIS — F17210 Nicotine dependence, cigarettes, uncomplicated: Secondary | ICD-10-CM | POA: Diagnosis not present

## 2021-05-23 DIAGNOSIS — Z7984 Long term (current) use of oral hypoglycemic drugs: Secondary | ICD-10-CM | POA: Insufficient documentation

## 2021-05-23 DIAGNOSIS — R112 Nausea with vomiting, unspecified: Secondary | ICD-10-CM | POA: Diagnosis not present

## 2021-05-23 DIAGNOSIS — R1111 Vomiting without nausea: Secondary | ICD-10-CM | POA: Diagnosis not present

## 2021-05-23 DIAGNOSIS — R188 Other ascites: Secondary | ICD-10-CM | POA: Diagnosis not present

## 2021-05-23 DIAGNOSIS — Z794 Long term (current) use of insulin: Secondary | ICD-10-CM | POA: Diagnosis not present

## 2021-05-23 DIAGNOSIS — E119 Type 2 diabetes mellitus without complications: Secondary | ICD-10-CM | POA: Diagnosis not present

## 2021-05-23 DIAGNOSIS — K219 Gastro-esophageal reflux disease without esophagitis: Secondary | ICD-10-CM | POA: Insufficient documentation

## 2021-05-23 DIAGNOSIS — R110 Nausea: Secondary | ICD-10-CM | POA: Diagnosis not present

## 2021-05-23 DIAGNOSIS — R10A0 Flank pain, unspecified side: Secondary | ICD-10-CM

## 2021-05-23 DIAGNOSIS — Z9049 Acquired absence of other specified parts of digestive tract: Secondary | ICD-10-CM | POA: Diagnosis not present

## 2021-05-23 DIAGNOSIS — K760 Fatty (change of) liver, not elsewhere classified: Secondary | ICD-10-CM | POA: Diagnosis not present

## 2021-05-23 DIAGNOSIS — R1012 Left upper quadrant pain: Secondary | ICD-10-CM | POA: Diagnosis present

## 2021-05-23 LAB — CBC WITH DIFFERENTIAL/PLATELET
Abs Immature Granulocytes: 0.03 10*3/uL (ref 0.00–0.07)
Basophils Absolute: 0.1 10*3/uL (ref 0.0–0.1)
Basophils Relative: 1 %
Eosinophils Absolute: 0.1 10*3/uL (ref 0.0–0.5)
Eosinophils Relative: 1 %
HCT: 42.1 % (ref 36.0–46.0)
Hemoglobin: 14.3 g/dL (ref 12.0–15.0)
Immature Granulocytes: 0 %
Lymphocytes Relative: 37 %
Lymphs Abs: 3.8 10*3/uL (ref 0.7–4.0)
MCH: 31.4 pg (ref 26.0–34.0)
MCHC: 34 g/dL (ref 30.0–36.0)
MCV: 92.5 fL (ref 80.0–100.0)
Monocytes Absolute: 0.7 10*3/uL (ref 0.1–1.0)
Monocytes Relative: 7 %
Neutro Abs: 5.7 10*3/uL (ref 1.7–7.7)
Neutrophils Relative %: 54 %
Platelets: 245 10*3/uL (ref 150–400)
RBC: 4.55 MIL/uL (ref 3.87–5.11)
RDW: 12 % (ref 11.5–15.5)
WBC: 10.4 10*3/uL (ref 4.0–10.5)
nRBC: 0 % (ref 0.0–0.2)

## 2021-05-23 LAB — COMPREHENSIVE METABOLIC PANEL
ALT: 47 U/L — ABNORMAL HIGH (ref 0–44)
AST: 40 U/L (ref 15–41)
Albumin: 4.6 g/dL (ref 3.5–5.0)
Alkaline Phosphatase: 98 U/L (ref 38–126)
Anion gap: 12 (ref 5–15)
BUN: 9 mg/dL (ref 6–20)
CO2: 25 mmol/L (ref 22–32)
Calcium: 9.4 mg/dL (ref 8.9–10.3)
Chloride: 98 mmol/L (ref 98–111)
Creatinine, Ser: 0.66 mg/dL (ref 0.44–1.00)
GFR, Estimated: >60 mL/min (ref >60)
Glucose, Bld: 129 mg/dL — ABNORMAL HIGH (ref 70–99)
Potassium: 3.7 mmol/L (ref 3.5–5.1)
Sodium: 135 mmol/L (ref 135–145)
Total Bilirubin: 1.3 mg/dL — ABNORMAL HIGH (ref 0.3–1.2)
Total Protein: 8.8 g/dL — ABNORMAL HIGH (ref 6.5–8.1)

## 2021-05-23 LAB — URINALYSIS, ROUTINE W REFLEX MICROSCOPIC
Bacteria, UA: NONE SEEN
Bilirubin Urine: NEGATIVE
Glucose, UA: NEGATIVE mg/dL
Hgb urine dipstick: NEGATIVE
Ketones, ur: 80 mg/dL — AB
Leukocytes,Ua: NEGATIVE
Nitrite: NEGATIVE
Protein, ur: NEGATIVE mg/dL
Specific Gravity, Urine: 1.011 (ref 1.005–1.030)
pH: 6 (ref 5.0–8.0)

## 2021-05-23 LAB — LIPASE, BLOOD: Lipase: 23 U/L (ref 11–51)

## 2021-05-23 LAB — CBG MONITORING, ED: Glucose-Capillary: 122 mg/dL — ABNORMAL HIGH (ref 70–99)

## 2021-05-23 LAB — HCG, QUANTITATIVE, PREGNANCY: hCG, Beta Chain, Quant, S: 1 m[IU]/mL (ref <5)

## 2021-05-23 LAB — D-DIMER, QUANTITATIVE: D-Dimer, Quant: 0.29 ug/mL-FEU (ref 0.00–0.50)

## 2021-05-23 MED ORDER — SODIUM CHLORIDE 0.9 % IV BOLUS
1000.0000 mL | Freq: Once | INTRAVENOUS | Status: AC
  Administered 2021-05-23: 1000 mL via INTRAVENOUS

## 2021-05-23 MED ORDER — HYDROMORPHONE HCL 1 MG/ML IJ SOLN
0.5000 mg | Freq: Once | INTRAMUSCULAR | Status: AC
Start: 2021-05-23 — End: 2021-05-23
  Administered 2021-05-23: 0.5 mg via INTRAVENOUS
  Filled 2021-05-23: qty 1

## 2021-05-23 MED ORDER — OXYCODONE-ACETAMINOPHEN 5-325 MG PO TABS: 1.0000 | ORAL_TABLET | Freq: Four times a day (QID) | ORAL | 0 refills | Status: DC | PRN

## 2021-05-23 MED ORDER — HYDROMORPHONE HCL 1 MG/ML IJ SOLN
1.0000 mg | Freq: Once | INTRAMUSCULAR | Status: AC
  Administered 2021-05-23: 1 mg via INTRAVENOUS
  Filled 2021-05-23: qty 1

## 2021-05-23 MED ORDER — SODIUM CHLORIDE (PF) 0.9 % IJ SOLN
INTRAMUSCULAR | Status: AC
  Filled 2021-05-23: qty 50

## 2021-05-23 MED ORDER — ONDANSETRON HCL 4 MG/2ML IJ SOLN
4.0000 mg | Freq: Once | INTRAMUSCULAR | Status: AC
  Administered 2021-05-23: 4 mg via INTRAVENOUS
  Filled 2021-05-23: qty 2

## 2021-05-23 MED ORDER — IOHEXOL 300 MG/ML  SOLN
100.0000 mL | Freq: Once | INTRAMUSCULAR | Status: AC | PRN
  Administered 2021-05-23: 100 mL via INTRAVENOUS

## 2021-05-23 NOTE — Discharge Instructions (Addendum)
Follow-up with your family doctor or [facility] gastroenterologist next week

## 2021-05-23 NOTE — ED Triage Notes (Signed)
Patient BIB [facility] from home complaining of left sided flank pain that started Thursday. Patient was here Thursday for N/V and sent home with pain medication. Patient tried to take a pill but no relief. No UTI symptoms or pain with urination. Pain is around her left flank area, no radiation. Patient is a diabetic and had her gallbladder out in the past.   EMS 140/82 74 HR 98% Room Air 128 CBG

## 2021-05-23 NOTE — ED Provider Notes (Signed)
[facility] DEPT Provider Note   CSN: [id number] Arrival date & time: 05/23/21  2229     History Chief Complaint  Patient presents with   Flank Pain    [name] is a 45 y.o. female.  Patient complains of some left upper quadrant and left flank pain  The history is provided by the patient and medical records. No language interpreter was used.  Flank Pain This is a new problem. The current episode started 2 days ago. The problem occurs constantly. The problem has not changed since onset.Pertinent negatives include no chest pain, no abdominal pain and no headaches. Nothing aggravates the symptoms. She has tried nothing for the symptoms.      Past Medical History:  Diagnosis Date   Acute cholecystitis s/p lap cholecystectomy 04/08/2021 04/07/2021   Cholelithiasis    Diabetes mellitus type 2 in nonobese ([place]) 04/20/2021   GERD (gastroesophageal reflux disease)    Tobacco abuse     Patient Active Problem List   Diagnosis Date Noted   DM (diabetes mellitus), type 2, uncontrolled ([place]) 04/21/2021   Colitis 04/20/2021   Abdominal pain 04/08/2021   Hypokalemia 04/08/2021   Hyperglycemia 04/08/2021   Dehydration 04/08/2021   GERD (gastroesophageal reflux disease)    Tobacco abuse    Nausea & vomiting    Acute cholecystitis s/p lap cholecystectomy 04/08/2021 04/07/2021    Past Surgical History:  Procedure Laterality Date   CESAREAN SECTION     CHOLECYSTECTOMY N/A 04/08/2021   Procedure: LAPAROSCOPIC CHOLECYSTECTOMy;  Surgeon: Coralie Keens, MD;  Location: [facility];  Service: General;  Laterality: N/A;     OB History   No obstetric history on file.     Family History  Problem Relation Age of Onset   Hypertension Mother     Social History   Tobacco Use   Smoking status: Every Day    Packs/day: 1.50    Pack years: 0.00    Types: Cigarettes   Smokeless tobacco: Never  Vaping Use   Vaping Use: Never used  Substance Use Topics    Alcohol use: Yes    Comment: socially   Drug use: Yes    Types: Marijuana    Home Medications Prior to Admission medications   Medication Sig Start Date End Date Taking? Authorizing Provider  oxyCODONE-acetaminophen (PERCOCET) 5-325 MG tablet Take 1 tablet by mouth every 6 (six) hours as needed. 05/23/21  Yes Milton Ferguson, MD  blood glucose meter kit and supplies KIT Dispense based on patient and insurance preference. Use up to four times daily as directed. 04/10/21   Little Ishikawa, MD  famotidine (PEPCID) 20 MG tablet Take 1 tablet (20 mg total) by mouth 2 (two) times daily. 01/09/20   Jaynee Eagles, PA-C  HYDROcodone-acetaminophen (NORCO/VICODIN) 5-325 MG tablet Take 1 tablet by mouth every 6 (six) hours as needed for moderate pain. 05/20/21   Milton Ferguson, MD  insulin isophane & regular human (NOVOLIN 70/30 FLEXPEN RELION) (70-30) 100 UNIT/ML KwikPen Inject 8 Units into the skin 2 (two) times daily. 04/10/21   Little Ishikawa, MD  metFORMIN (GLUCOPHAGE) 1000 MG tablet Take 1 tablet (1,000 mg total) by mouth 2 (two) times daily with a meal. 04/22/21   Kerin Perna, NP  ondansetron (ZOFRAN ODT) 4 MG disintegrating tablet 63m ODT q4 hours prn nausea/vomit 05/20/21   ZMilton Ferguson MD  ondansetron (ZOFRAN) 4 MG tablet Take 1 tablet (4 mg total) by mouth daily as needed for nausea or vomiting.  04/10/21 04/10/22  Little Ishikawa, MD  pantoprazole (PROTONIX) 20 MG tablet Take 1 tablet (20 mg total) by mouth daily. 05/20/21   Milton Ferguson, MD  promethazine (PHENERGAN) 25 MG tablet Take 1 tablet (25 mg total) by mouth every 8 (eight) hours as needed for nausea or vomiting. 04/24/21   Petrucelli, Samantha R, PA-C  sitaGLIPtin (JANUVIA) 100 MG tablet Take 1 tablet (100 mg total) by mouth daily. 04/22/21   Kerin Perna, NP    Allergies    Patient has no known allergies.  Review of Systems   Review of Systems  Constitutional:  Negative for appetite change and fatigue.  HENT:   Negative for congestion, ear discharge and sinus pressure.   Eyes:  Negative for discharge.  Respiratory:  Negative for cough.   Cardiovascular:  Negative for chest pain.  Gastrointestinal:  Negative for abdominal pain and diarrhea.  Genitourinary:  Positive for flank pain. Negative for frequency and hematuria.  Musculoskeletal:  Negative for back pain.  Skin:  Negative for rash.  Neurological:  Negative for seizures and headaches.  Psychiatric/Behavioral:  Negative for hallucinations.    Physical Exam Updated Vital Signs BP (!) 154/84   Pulse 66   Temp 98.4 F (36.9 C) (Oral)   Resp 18   Ht _0  (1.753 m)   Wt 68.9 kg   LMP  (LMP Unknown) Comment: neg test  SpO2 100%   BMI 22.43 kg/m   Physical Exam Constitutional:      Appearance: Normal appearance. She is well-developed.  HENT:     Head: Normocephalic.     Right Ear: Tympanic membrane normal.     Nose: Nose normal.  Eyes:     General: No scleral icterus.    Conjunctiva/sclera: Conjunctivae normal.  Neck:     Thyroid: No thyromegaly.  Cardiovascular:     Rate and Rhythm: Normal rate and regular rhythm.     Heart sounds: No murmur heard.   No friction rub. No gallop.  Pulmonary:     Breath sounds: No stridor. No wheezing or rales.  Chest:     Chest wall: No tenderness.  Abdominal:     General: There is no distension.     Tenderness: There is abdominal tenderness. There is no rebound.     Comments: Minimal left upper quadrant tenderness  Musculoskeletal:        General: Normal range of motion.     Cervical back: Neck supple.  Lymphadenopathy:     Cervical: No cervical adenopathy.  Skin:    Findings: No erythema or rash.  Neurological:     Mental Status: She is alert and oriented to person, place, and time.     Motor: No abnormal muscle tone.     Coordination: Coordination normal.  Psychiatric:        Behavior: Behavior normal.    ED Results / Procedures / Treatments   Labs (all labs ordered are  listed, but only abnormal results are displayed) Labs Reviewed  COMPREHENSIVE METABOLIC PANEL - Abnormal; Notable for the following components:      Result Value   Glucose, Bld 129 (*)    Total Protein 8.8 (*)    ALT 47 (*)    Total Bilirubin 1.3 (*)    All other components within normal limits  URINALYSIS, ROUTINE W REFLEX MICROSCOPIC - Abnormal; Notable for the following components:   APPearance HAZY (*)    Ketones, ur 80 (*)    All other components within  normal limits  CBG MONITORING, ED - Abnormal; Notable for the following components:   Glucose-Capillary 122 (*)    All other components within normal limits  CBC WITH DIFFERENTIAL/PLATELET  LIPASE, BLOOD  HCG, QUANTITATIVE, PREGNANCY  D-DIMER, QUANTITATIVE    EKG None  Radiology CT ABDOMEN PELVIS W CONTRAST  Result Date: 05/23/2021 CLINICAL DATA:  Abdominal abscess suspected. EXAM: CT ABDOMEN AND PELVIS WITH CONTRAST TECHNIQUE: Multidetector CT imaging of the abdomen and pelvis was performed using the standard protocol following bolus administration of intravenous contrast. CONTRAST:  13m OMNIPAQUE IOHEXOL 300 MG/ML  SOLN COMPARISON:  Apr 23, 2021 FINDINGS: Lower chest: No acute abnormality. Hepatobiliary: Status post cholecystectomy. There is residual soft tissue thickening at the cholecystectomy bed. Focal fatty infiltration of the liver adjacent to the falciform ligament. No biliary ductal dilation. Pancreas: Unremarkable. No pancreatic ductal dilatation or surrounding inflammatory changes. Spleen: Normal in size without focal abnormality. Adrenals/Urinary Tract: Adrenal glands are unremarkable. Kidneys are normal, without renal calculi, focal lesion, or hydronephrosis. Bladder is unremarkable. Stomach/Bowel: Stomach is within normal limits. Appendix appears normal. No evidence of bowel wall thickening, distention, or inflammatory changes. Vascular/Lymphatic: No significant vascular findings are present. No enlarged abdominal or  pelvic lymph nodes. Reproductive: Uterus and bilateral adnexa are unremarkable. Other: Small amount of free fluid in the pelvis, which measures water density. The previously described left rectus abdominus sheath hematoma has resolved. Musculoskeletal: No acute or significant osseous findings. IMPRESSION: 1. Status post cholecystectomy with residual soft tissue thickening at the cholecystectomy bed. This may represent postsurgical changes, however infectious or inflammatory changes cannot be excluded. Overall the soft tissue thickening is mildly improved when compared to Apr 23, 2021. 2. Small amount of free fluid in the pelvis. 3. Interval resolution of the previously described left rectus abdominus sheath hematoma. Electronically Signed   By: DFidela SalisburyM.D.   On: 05/23/2021 11:54    Procedures Procedures   Medications Ordered in ED Medications  sodium chloride (PF) 0.9 % injection (has no administration in time range)  HYDROmorphone (DILAUDID) injection 0.5 mg (has no administration in time range)  sodium chloride 0.9 % bolus 1,000 mL (1,000 mLs Intravenous New Bag/Given 05/23/21 0817)  HYDROmorphone (DILAUDID) injection 1 mg (1 mg Intravenous Given 05/23/21 0818)  ondansetron (ZOFRAN) injection 4 mg (4 mg Intravenous Given 05/23/21 0818)  iohexol (OMNIPAQUE) 300 MG/ML solution 100 mL (100 mLs Intravenous Contrast Given 05/23/21 1051)    ED Course  I have reviewed the triage vital signs and the nursing notes.  Pertinent labs & imaging results that were available during my care of the patient were reviewed by me and considered in my medical decision making (see chart for details).    MDM Rules/Calculators/A&P                          Labs including urinalysis are unremarkable.  CT scan evidence unremarkable.  Patient will be given some pain medicine and referred to GI for follow-up Final Clinical Impression(s) / ED Diagnoses Final diagnoses:  Flank pain    Rx / DC Orders ED  Discharge Orders          Ordered    oxyCODONE-acetaminophen (PERCOCET) 5-325 MG tablet  Every 6 hours PRN        05/23/21 1512             ZMilton Ferguson MD 05/25/21 1750

## 2021-05-23 NOTE — ED Notes (Signed)
Pt attached to cardiac monitor x2. A&O x4. VSS. Pt ambulatory to bathroom at this time.

## 2021-05-23 NOTE — ED Notes (Signed)
Provider at the bedside to evaluate. 

## 2021-05-25 ENCOUNTER — Telehealth (INDEPENDENT_AMBULATORY_CARE_PROVIDER_SITE_OTHER): Payer: Medicaid Other | Admitting: Nurse Practitioner

## 2021-05-25 ENCOUNTER — Other Ambulatory Visit: Payer: Self-pay

## 2021-05-25 DIAGNOSIS — R109 Unspecified abdominal pain: Secondary | ICD-10-CM

## 2021-05-25 DIAGNOSIS — Z9049 Acquired absence of other specified parts of digestive tract: Secondary | ICD-10-CM

## 2021-05-25 DIAGNOSIS — R112 Nausea with vomiting, unspecified: Secondary | ICD-10-CM

## 2021-05-25 MED ORDER — PROMETHAZINE HCL 25 MG PO TABS: 25.0000 mg | ORAL_TABLET | Freq: Three times a day (TID) | ORAL | 0 refills | Status: DC | PRN

## 2021-05-25 MED ORDER — DOCUSATE SODIUM 100 MG PO CAPS: 100.0000 mg | ORAL_CAPSULE | Freq: Two times a day (BID) | ORAL | 0 refills | Status: DC

## 2021-05-25 NOTE — Progress Notes (Signed)
Virtual Visit via Telephone Note  I connected with [name] on 05/25/21 at  8:30 AM EDT by telephone and verified that I am speaking with the correct person using two identifiers.  Location: Patient: home Provider: office   I discussed the limitations, risks, security and privacy concerns of performing an evaluation and management service by telephone and the availability of in person appointments. I also discussed with the patient that there may be a patient responsible charge related to this service. The patient expressed understanding and agreed to proceed.   History of Present Illness:  Patient presents today for transition of care visit through televisit.  Over the past few weeks patient has been to the ED numerous times for vomiting and abdominal pain after recent cholecystectomy.  Patient has yet to follow-up with GI for postop appointment.  Patient states that she is unable to pass gas and has not had a bowel movement recently.  We discussed that she needs to talkto the triage nurse today regarding these issues at the GI office. Denies f/c/s, hemoptysis, PND, chest pain or edema.       Observations/Objective:  Vitals with BMI 05/23/2021 05/23/2021 05/23/2021  Height - - -  Weight - - -  BMI - - -  Systolic 154 - -  Diastolic 84 - -  Pulse 66 63 61      Assessment and Plan:  Patient Instructions  Status post cholecystectomy: Nausea Vomiting Abdominal pain Constipation:   Please call GI this A.M. - need follow up ASAP - speak to triage nurse this am  Order colace  Refilled Phenergan  Stay active - walk frequently  Follow up:  Follow up with GI and PCP ASAP   Minimally Invasive Cholecystectomy, Care After This sheet gives you information about how to care for yourself after your procedure. Your doctor may also give you more specific instructions. If youhave problems or questions, contact your doctor. What can I expect after the procedure? After the  procedure, it is common: To have pain at the areas of surgery. You will be given medicines for pain. To vomit or feel like you may vomit. To feel fullness in the belly (bloating) or to have pain in the shoulder. This comes from the gas that was used during the surgery. Follow these instructions at home: Medicines Take over-the-counter and prescription medicines only as told by your doctor. If you were prescribed an antibiotic medicine, take it as told by your doctor. Do not stop using the antibiotic even if you start to feel better. Ask your doctor if the medicine prescribed to you: Requires you to avoid driving or using machinery. Can cause trouble pooping (constipation). You may need to take these actions to prevent or treat trouble pooping: Drink enough fluid to keep your pee (urine) pale yellow. Take over-the-counter or prescription medicines. Eat foods that are high in fiber. These include beans, whole grains, and fresh fruits and vegetables. Limit foods that are high in fat and sugar. These include fried or sweet foods. Incision care  Follow instructions from your doctor about how to take care of your cuts from surgery (incisions). Make sure you: Wash your hands with soap and water for at least 20 seconds before and after you change your bandage (dressing). If you cannot use soap and water, use hand sanitizer. Change your bandage as told by your doctor. Leave stitches (sutures), skin glue, or skin tape (adhesive) strips in place. They may need to stay in place for  2 weeks or longer. If tape strips get loose and curl up, you may trim the loose edges. Do not remove tape strips completely unless your doctor says it is okay. Do not take baths, swim, or use a hot tub until your doctor approves. Ask your doctor if you may take showers. You may only be allowed to take sponge baths. Check your surgery area every day for signs of infection. Check for: More redness, swelling, or pain. Fluid or  blood. Warmth. Pus or a bad smell.  Activity Rest as told by your doctor. Do not sit for a long time without moving. Get up to take short walks every 1-2 hours. This is important. Ask for help if you feel weak or unsteady. Do not lift anything that is heavier than 10 lb (4.5 kg), or the limit that you are told, until your doctor says that it is safe. Do not play contact sports until your doctor says it is okay. Do not return to work or school until your doctor says it is okay. Return to your normal activities as told by your doctor. Ask your doctor what activities are safe for you. General instructions If you were given a medicine to help you relax (sedative) during your procedure, it can affect you for many hours. Do not drive or use machinery until your doctor says that it is safe. Keep all follow-up visits as told by your doctor. This is important. Contact a doctor if: You get a rash. You have more redness, swelling, or pain around your cuts from surgery. You have fluid or blood coming from your cuts from surgery. Your cuts from surgery feel warm to the touch. You have pus or a bad smell coming from your cuts from surgery. You have a fever. One or more of your cuts from surgery breaks open. Get help right away if: You have trouble breathing. You have chest pain. You have pain that is getting worse in your shoulders. You faint or feel dizzy when you stand. You have very bad pain in your belly (abdomen). You feel like you may vomit or you vomit, and this lasts for more than one day. You have leg pain. Summary After your surgery, it is common to have pain at the areas of surgery. You may also have vomiting or fullness in the belly. Follow your doctor's instructions about medicine, activity restrictions, and caring for your surgery areas. Do not do activities that require a lot of effort. Contact a doctor if you have a fever or other signs of infection, such as more redness, swelling,  or pain around the cuts from surgery. Get help right away if you have chest pain, increasing pain in the shoulders, or trouble breathing. This information is not intended to replace advice given to you by your health care provider. Make sure you discuss any questions you have with your healthcare provider. Document Revised: 11/06/2019 Document Reviewed: 11/06/2019 Elsevier Patient Education  2022 [employer].      I discussed the assessment and treatment plan with the patient. The patient was provided an opportunity to ask questions and all were answered. The patient agreed with the plan and demonstrated an understanding of the instructions.   The patient was advised to call back or seek an in-person evaluation if the symptoms worsen or if the condition fails to improve as anticipated.  I provided 23 minutes of non-face-to-face time during this encounter.   Ivonne Andrew, NP

## 2021-05-25 NOTE — Patient Instructions (Addendum)
Status post cholecystectomy: Nausea Vomiting Abdominal pain Constipation:   Please call GI this A.M. - need follow up ASAP - speak to triage nurse this am  Order colace  Refilled Phenergan  Stay active - walk frequently  Follow up:  Follow up with GI and PCP ASAP   Minimally Invasive Cholecystectomy, Care After This sheet gives you information about how to care for yourself after your procedure. Your doctor may also give you more specific instructions. If youhave problems or questions, contact your doctor. What can I expect after the procedure? After the procedure, it is common: To have pain at the areas of surgery. You will be given medicines for pain. To vomit or feel like you may vomit. To feel fullness in the belly (bloating) or to have pain in the shoulder. This comes from the gas that was used during the surgery. Follow these instructions at home: Medicines Take over-the-counter and prescription medicines only as told by your doctor. If you were prescribed an antibiotic medicine, take it as told by your doctor. Do not stop using the antibiotic even if you start to feel better. Ask your doctor if the medicine prescribed to you: Requires you to avoid driving or using machinery. Can cause trouble pooping (constipation). You may need to take these actions to prevent or treat trouble pooping: Drink enough fluid to keep your pee (urine) pale yellow. Take over-the-counter or prescription medicines. Eat foods that are high in fiber. These include beans, whole grains, and fresh fruits and vegetables. Limit foods that are high in fat and sugar. These include fried or sweet foods. Incision care  Follow instructions from your doctor about how to take care of your cuts from surgery (incisions). Make sure you: Wash your hands with soap and water for at least 20 seconds before and after you change your bandage (dressing). If you cannot use soap and water, use hand sanitizer. Change  your bandage as told by your doctor. Leave stitches (sutures), skin glue, or skin tape (adhesive) strips in place. They may need to stay in place for 2 weeks or longer. If tape strips get loose and curl up, you may trim the loose edges. Do not remove tape strips completely unless your doctor says it is okay. Do not take baths, swim, or use a hot tub until your doctor approves. Ask your doctor if you may take showers. You may only be allowed to take sponge baths. Check your surgery area every day for signs of infection. Check for: More redness, swelling, or pain. Fluid or blood. Warmth. Pus or a bad smell.  Activity Rest as told by your doctor. Do not sit for a long time without moving. Get up to take short walks every 1-2 hours. This is important. Ask for help if you feel weak or unsteady. Do not lift anything that is heavier than 10 lb (4.5 kg), or the limit that you are told, until your doctor says that it is safe. Do not play contact sports until your doctor says it is okay. Do not return to work or school until your doctor says it is okay. Return to your normal activities as told by your doctor. Ask your doctor what activities are safe for you. General instructions If you were given a medicine to help you relax (sedative) during your procedure, it can affect you for many hours. Do not drive or use machinery until your doctor says that it is safe. Keep all follow-up visits as told by your doctor.  This is important. Contact a doctor if: You get a rash. You have more redness, swelling, or pain around your cuts from surgery. You have fluid or blood coming from your cuts from surgery. Your cuts from surgery feel warm to the touch. You have pus or a bad smell coming from your cuts from surgery. You have a fever. One or more of your cuts from surgery breaks open. Get help right away if: You have trouble breathing. You have chest pain. You have pain that is getting worse in your  shoulders. You faint or feel dizzy when you stand. You have very bad pain in your belly (abdomen). You feel like you may vomit or you vomit, and this lasts for more than one day. You have leg pain. Summary After your surgery, it is common to have pain at the areas of surgery. You may also have vomiting or fullness in the belly. Follow your doctor's instructions about medicine, activity restrictions, and caring for your surgery areas. Do not do activities that require a lot of effort. Contact a doctor if you have a fever or other signs of infection, such as more redness, swelling, or pain around the cuts from surgery. Get help right away if you have chest pain, increasing pain in the shoulders, or trouble breathing. This information is not intended to replace advice given to you by your health care provider. Make sure you discuss any questions you have with your healthcare provider. Document Revised: 11/06/2019 Document Reviewed: 11/06/2019 Elsevier Patient Education  2022 [employer].

## 2021-05-26 ENCOUNTER — Telehealth (INDEPENDENT_AMBULATORY_CARE_PROVIDER_SITE_OTHER): Payer: Self-pay

## 2021-05-26 NOTE — Telephone Encounter (Signed)
Copied from CRM [id number]. Topic: General - Other >> May 25, 2021  1:54 PM Jaquita Rector A wrote: Reason for CRM: Patient called in asking to speak to the social worker / person that handles Medicaid say that she have an appointment today but does not know what to do please call back at Ph# [phone]  Erskine Squibb are you able to help the patient with information. It looks like she has family planning medicaid.

## 2021-05-26 NOTE — Telephone Encounter (Signed)
Call returned to patient # [phone], message left with call back requested to this CM

## 2021-05-28 NOTE — Telephone Encounter (Signed)
Call placed to patient.  She said she is not sure if she has a Medicaid application pending.  Instructed her to call [facility] and request to speak with the Financial Counseling Department and they should be able to direct her to who she needs to speak with. She said she understood and would call

## 2021-06-17 ENCOUNTER — Encounter: Payer: Self-pay | Admitting: Gastroenterology

## 2021-06-21 ENCOUNTER — Encounter (HOSPITAL_COMMUNITY): Payer: Self-pay | Admitting: Student

## 2021-06-21 ENCOUNTER — Other Ambulatory Visit: Payer: Self-pay

## 2021-06-21 ENCOUNTER — Emergency Department (HOSPITAL_COMMUNITY)
Admission: EM | Admit: 2021-06-21 | Discharge: 2021-06-22 | Disposition: A | Discharge status: Home / Self Care | Payer: Medicaid Other | Attending: Emergency Medicine | Admitting: Emergency Medicine

## 2021-06-21 DIAGNOSIS — F17210 Nicotine dependence, cigarettes, uncomplicated: Secondary | ICD-10-CM | POA: Insufficient documentation

## 2021-06-21 DIAGNOSIS — R42 Dizziness and giddiness: Secondary | ICD-10-CM | POA: Diagnosis not present

## 2021-06-21 DIAGNOSIS — Z794 Long term (current) use of insulin: Secondary | ICD-10-CM | POA: Diagnosis not present

## 2021-06-21 DIAGNOSIS — Z7984 Long term (current) use of oral hypoglycemic drugs: Secondary | ICD-10-CM | POA: Diagnosis not present

## 2021-06-21 DIAGNOSIS — R9431 Abnormal electrocardiogram [ECG] [EKG]: Secondary | ICD-10-CM | POA: Diagnosis not present

## 2021-06-21 DIAGNOSIS — E119 Type 2 diabetes mellitus without complications: Secondary | ICD-10-CM | POA: Insufficient documentation

## 2021-06-21 DIAGNOSIS — F12188 Cannabis abuse with other cannabis-induced disorder: Secondary | ICD-10-CM | POA: Insufficient documentation

## 2021-06-21 DIAGNOSIS — R112 Nausea with vomiting, unspecified: Secondary | ICD-10-CM | POA: Diagnosis not present

## 2021-06-21 DIAGNOSIS — F1290 Cannabis use, unspecified, uncomplicated: Secondary | ICD-10-CM

## 2021-06-21 DIAGNOSIS — Z20822 Contact with and (suspected) exposure to covid-19: Secondary | ICD-10-CM | POA: Insufficient documentation

## 2021-06-21 DIAGNOSIS — Z79899 Other long term (current) drug therapy: Secondary | ICD-10-CM | POA: Insufficient documentation

## 2021-06-21 DIAGNOSIS — R509 Fever, unspecified: Secondary | ICD-10-CM | POA: Diagnosis not present

## 2021-06-21 DIAGNOSIS — R739 Hyperglycemia, unspecified: Secondary | ICD-10-CM | POA: Diagnosis not present

## 2021-06-21 DIAGNOSIS — F1410 Cocaine abuse, uncomplicated: Secondary | ICD-10-CM

## 2021-06-21 LAB — RAPID URINE DRUG SCREEN, HOSP PERFORMED
Amphetamines: NOT DETECTED
Barbiturates: NOT DETECTED
Benzodiazepines: NOT DETECTED
Cocaine: POSITIVE — AB
Opiates: NOT DETECTED
Tetrahydrocannabinol: POSITIVE — AB

## 2021-06-21 LAB — URINALYSIS, ROUTINE W REFLEX MICROSCOPIC
Bacteria, UA: NONE SEEN
Bilirubin Urine: NEGATIVE
Glucose, UA: >=500 mg/dL — AB
Ketones, ur: 20 mg/dL — AB
Leukocytes,Ua: NEGATIVE
Nitrite: NEGATIVE
Protein, ur: NEGATIVE mg/dL
Specific Gravity, Urine: 1.013 (ref 1.005–1.030)
pH: 7 (ref 5.0–8.0)

## 2021-06-21 LAB — COMPREHENSIVE METABOLIC PANEL
ALT: 31 U/L (ref 0–44)
AST: 30 U/L (ref 15–41)
Albumin: 4.3 g/dL (ref 3.5–5.0)
Alkaline Phosphatase: 74 U/L (ref 38–126)
Anion gap: 12 (ref 5–15)
BUN: 9 mg/dL (ref 6–20)
CO2: 24 mmol/L (ref 22–32)
Calcium: 9.4 mg/dL (ref 8.9–10.3)
Chloride: 100 mmol/L (ref 98–111)
Creatinine, Ser: 0.54 mg/dL (ref 0.44–1.00)
GFR, Estimated: >60 mL/min (ref >60)
Glucose, Bld: 237 mg/dL — ABNORMAL HIGH (ref 70–99)
Potassium: 3.6 mmol/L (ref 3.5–5.1)
Sodium: 136 mmol/L (ref 135–145)
Total Bilirubin: 0.8 mg/dL (ref 0.3–1.2)
Total Protein: 8.3 g/dL — ABNORMAL HIGH (ref 6.5–8.1)

## 2021-06-21 LAB — CBC WITH DIFFERENTIAL/PLATELET
Abs Immature Granulocytes: 0.05 10*3/uL (ref 0.00–0.07)
Basophils Absolute: 0 10*3/uL (ref 0.0–0.1)
Basophils Relative: 0 %
Eosinophils Absolute: 0 10*3/uL (ref 0.0–0.5)
Eosinophils Relative: 0 %
HCT: 41.5 % (ref 36.0–46.0)
Hemoglobin: 14.1 g/dL (ref 12.0–15.0)
Immature Granulocytes: 1 %
Lymphocytes Relative: 17 %
Lymphs Abs: 1.5 10*3/uL (ref 0.7–4.0)
MCH: 31.6 pg (ref 26.0–34.0)
MCHC: 34 g/dL (ref 30.0–36.0)
MCV: 93 fL (ref 80.0–100.0)
Monocytes Absolute: 0.2 10*3/uL (ref 0.1–1.0)
Monocytes Relative: 2 %
Neutro Abs: 6.7 10*3/uL (ref 1.7–7.7)
Neutrophils Relative %: 80 %
Platelets: 227 10*3/uL (ref 150–400)
RBC: 4.46 MIL/uL (ref 3.87–5.11)
RDW: 11.9 % (ref 11.5–15.5)
WBC: 8.4 10*3/uL (ref 4.0–10.5)
nRBC: 0 % (ref 0.0–0.2)

## 2021-06-21 LAB — RESP PANEL BY RT-PCR (FLU A&B, COVID) ARPGX2
Influenza A by PCR: NEGATIVE
Influenza B by PCR: NEGATIVE
SARS Coronavirus 2 by RT PCR: NEGATIVE

## 2021-06-21 LAB — ETHANOL: Alcohol, Ethyl (B): <10 mg/dL (ref <10)

## 2021-06-21 MED ORDER — KETOROLAC TROMETHAMINE 30 MG/ML IJ SOLN
30.0000 mg | Freq: Once | INTRAMUSCULAR | Status: AC
  Administered 2021-06-21: 30 mg via INTRAMUSCULAR
  Filled 2021-06-21: qty 1

## 2021-06-21 MED ORDER — SODIUM CHLORIDE 0.9 % IV BOLUS
1000.0000 mL | Freq: Once | INTRAVENOUS | Status: AC
  Administered 2021-06-21: 1000 mL via INTRAVENOUS

## 2021-06-21 MED ORDER — ONDANSETRON HCL 4 MG/2ML IJ SOLN
4.0000 mg | Freq: Once | INTRAMUSCULAR | Status: AC
  Administered 2021-06-21: 4 mg via INTRAVENOUS
  Filled 2021-06-21: qty 2

## 2021-06-21 MED ORDER — FENTANYL CITRATE (PF) 100 MCG/2ML IJ SOLN
100.0000 ug | INTRAMUSCULAR | Status: DC | PRN
  Administered 2021-06-21: 100 ug via INTRAVENOUS
  Filled 2021-06-21: qty 2

## 2021-06-21 MED ORDER — PROCHLORPERAZINE EDISYLATE 10 MG/2ML IJ SOLN
10.0000 mg | Freq: Once | INTRAMUSCULAR | Status: AC
  Administered 2021-06-21: 10 mg via INTRAMUSCULAR
  Filled 2021-06-21: qty 2

## 2021-06-21 MED ORDER — ONDANSETRON HCL 8 MG PO TABS: 8.0000 mg | ORAL_TABLET | Freq: Three times a day (TID) | ORAL | 0 refills | Status: DC | PRN

## 2021-06-21 NOTE — ED Notes (Signed)
Upon entering the room, patient was seen sticking her finger down her throat to vomit.  When asked about drug use during triage questions, patient does endorse marijuana use.

## 2021-06-21 NOTE — ED Notes (Signed)
Upon entering room, pt found with hand in mouth and trying to throw up.  Pt also making "smacking noises" with tongue and repetitive body movements.

## 2021-06-21 NOTE — ED Triage Notes (Signed)
Patient BIB [facility] c/o N/V that started this morning.  Patient has been having since she had gallbladder surgery in March.  Patient is supposed to see GI on 07/15/21.  CBG 257 160/100 99% room air 80-HR 18-RR 96.0-temp

## 2021-06-21 NOTE — ED Provider Notes (Signed)
[facility] DEPT Provider Note   CSN: [id number] Arrival date & time: 06/21/21  1843     History Chief Complaint  Patient presents with   Emesis   Nausea   Abdominal Pain    [name] is a 45 y.o. female.  HPI She is here for evaluation of recurrent vomiting.  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  She went home and took a promethazine tablet around 1 PM today and persists with vomiting.  She states she is seeing "a little blood in my vomit."  Dizziness, weakness, fever or chills.  She has had intermittent episodes similar to this, and plans on seeing a GI doctor about it, on 07/16/2021.  There are no other known active modifying factors.    Past Medical History:  Diagnosis Date   Acute cholecystitis s/p lap cholecystectomy 04/08/2021 04/07/2021   Cholelithiasis    Diabetes mellitus type 2 in nonobese ([place]) 04/20/2021   GERD (gastroesophageal reflux disease)    Tobacco abuse     Patient Active Problem List   Diagnosis Date Noted   DM (diabetes mellitus), type 2, uncontrolled ([place]) 04/21/2021   Colitis 04/20/2021   Abdominal pain 04/08/2021   Hypokalemia 04/08/2021   Hyperglycemia 04/08/2021   Dehydration 04/08/2021   GERD (gastroesophageal reflux disease)    Tobacco abuse    Nausea & vomiting    Acute cholecystitis s/p lap cholecystectomy 04/08/2021 04/07/2021   Lumbar radiculopathy 12/02/2020    Past Surgical History:  Procedure Laterality Date   CESAREAN SECTION     CHOLECYSTECTOMY N/A 04/08/2021   Procedure: LAPAROSCOPIC CHOLECYSTECTOMy;  Surgeon: Coralie Keens, MD;  Location: [facility];  Service: General;  Laterality: N/A;     OB History   No obstetric history on file.     Family History  Problem Relation Age of Onset   Hypertension Mother     Social History   Tobacco Use   Smoking status: Every Day    Packs/day: 1.50    Types: Cigarettes   Smokeless tobacco: Never  Vaping Use   Vaping Use: Never  used  Substance Use Topics   Alcohol use: Yes    Comment: socially   Drug use: Yes    Types: Marijuana    Home Medications Prior to Admission medications   Medication Sig Start Date End Date Taking? Authorizing Provider  ondansetron (ZOFRAN) 8 MG tablet Take 1 tablet (8 mg total) by mouth every 8 (eight) hours as needed for nausea or vomiting. 06/21/21  Yes Daleen Bo, MD  blood glucose meter kit and supplies KIT Dispense based on patient and insurance preference. Use up to four times daily as directed. 04/10/21   Little Ishikawa, MD  docusate sodium (COLACE) 100 MG capsule Take 1 capsule (100 mg total) by mouth 2 (two) times daily. 05/25/21   Fenton Foy, NP  famotidine (PEPCID) 20 MG tablet Take 1 tablet (20 mg total) by mouth 2 (two) times daily. 01/09/20   Jaynee Eagles, PA-C  HYDROcodone-acetaminophen (NORCO/VICODIN) 5-325 MG tablet Take 1 tablet by mouth every 6 (six) hours as needed for moderate pain. 05/20/21   Milton Ferguson, MD  insulin isophane & regular human (NOVOLIN 70/30 FLEXPEN RELION) (70-30) 100 UNIT/ML KwikPen Inject 8 Units into the skin 2 (two) times daily. 04/10/21   Little Ishikawa, MD  metFORMIN (GLUCOPHAGE) 1000 MG tablet Take 1 tablet (1,000 mg total) by mouth 2 (two) times daily with a meal. 04/22/21  Kerin Perna, NP  ondansetron (ZOFRAN ODT) 4 MG disintegrating tablet 7m ODT q4 hours prn nausea/vomit 05/20/21   ZMilton Ferguson MD  oxyCODONE-acetaminophen (PERCOCET) 5-325 MG tablet Take 1 tablet by mouth every 6 (six) hours as needed. 05/23/21   ZMilton Ferguson MD  pantoprazole (PROTONIX) 20 MG tablet Take 1 tablet (20 mg total) by mouth daily. 05/20/21   ZMilton Ferguson MD  promethazine (PHENERGAN) 25 MG tablet Take 1 tablet (25 mg total) by mouth every 8 (eight) hours as needed for nausea or vomiting. 05/25/21   NFenton Foy NP  sitaGLIPtin (JANUVIA) 100 MG tablet Take 1 tablet (100 mg total) by mouth daily. 04/22/21   EKerin Perna NP     Allergies    Patient has no known allergies.  Review of Systems   Review of Systems  All other systems reviewed and are negative.  Physical Exam Updated Vital Signs BP (!) 160/100   Pulse 78   Temp 98.1 F (36.7 C) (Oral)   Resp 18   Ht _0  (1.753 m)   Wt 65.8 kg   LMP  (LMP Unknown) Comment: neg test  SpO2 99%   BMI 21.41 kg/m   Physical Exam Vitals and nursing note reviewed.  Constitutional:      Appearance: She is well-developed. She is not ill-appearing.  HENT:     Head: Normocephalic and atraumatic.     Right Ear: External ear normal.     Left Ear: External ear normal.  Eyes:     Conjunctiva/sclera: Conjunctivae normal.     Pupils: Pupils are equal, round, and reactive to light.  Neck:     Trachea: Phonation normal.  Cardiovascular:     Rate and Rhythm: Normal rate.  Pulmonary:     Effort: Pulmonary effort is normal.  Abdominal:     General: There is distension.     Comments: Sits up on the bed easily without discomfort in the abdomen.  Musculoskeletal:        General: Normal range of motion.     Cervical back: Normal range of motion and neck supple.  Skin:    General: Skin is warm and dry.  Neurological:     Mental Status: She is alert and oriented to person, place, and time.     Cranial Nerves: No cranial nerve deficit.     Sensory: No sensory deficit.     Motor: No abnormal muscle tone.     Coordination: Coordination normal.  Psychiatric:        Mood and Affect: Mood normal.        Behavior: Behavior normal.        Thought Content: Thought content normal.        Judgment: Judgment normal.    ED Results / Procedures / Treatments   Labs (all labs ordered are listed, but only abnormal results are displayed) Labs Reviewed  RAPID URINE DRUG SCREEN, HOSP PERFORMED - Abnormal; Notable for the following components:      Result Value   Cocaine POSITIVE (*)    Tetrahydrocannabinol POSITIVE (*)    All other components within normal limits   URINALYSIS, ROUTINE W REFLEX MICROSCOPIC - Abnormal; Notable for the following components:   Color, Urine STRAW (*)    Glucose, UA >=500 (*)    Hgb urine dipstick SMALL (*)    Ketones, ur 20 (*)    All other components within normal limits  COMPREHENSIVE METABOLIC PANEL - Abnormal; Notable for the following components:  Glucose, Bld 237 (*)    Total Protein 8.3 (*)    All other components within normal limits  RESP PANEL BY RT-PCR (FLU A&B, COVID) ARPGX2  ETHANOL  CBC WITH DIFFERENTIAL/PLATELET    EKG EKG Interpretation  Date/Time:  Sunday June 21 2021 21:46:42 EDT Ventricular Rate:  79 PR Interval:  150 QRS Duration: 80 QT Interval:  430 QTC Calculation: 493 R Axis:   74 Text Interpretation: Normal sinus rhythm with sinus arrhythmia Possible Left atrial enlargement Nonspecific ST abnormality Prolonged QT Abnormal ECG Since last tracing QT has shortened Otherwise no significant change Confirmed by Daleen Bo [phone]) on 06/21/2021 10:52:21 PM  Radiology No results found.  Procedures .Critical Care  Date/Time: 06/21/2021 11:56 PM Performed by: Daleen Bo, MD Authorized by: Daleen Bo, MD   Critical care provider statement:    Critical care time (minutes):  35   Critical care start time:  06/21/2021 7:15 PM   Critical care end time:  06/21/2021 11:56 PM   Critical care time was exclusive of:  Separately billable procedures and treating other patients   Critical care was necessary to treat or prevent imminent or life-threatening deterioration of the following conditions:  Metabolic crisis   Critical care was time spent personally by me on the following activities:  Blood draw for specimens, development of treatment plan with patient or surrogate, discussions with consultants, evaluation of patient's response to treatment, examination of patient, obtaining history from patient or surrogate, ordering and performing treatments and interventions, ordering and review of  laboratory studies, pulse oximetry, re-evaluation of patient's condition, review of old charts and ordering and review of radiographic studies   Medications Ordered in ED Medications  fentaNYL (SUBLIMAZE) injection 100 mcg (100 mcg Intravenous Given 06/21/21 2159)  prochlorperazine (COMPAZINE) injection 10 mg (10 mg Intramuscular Given 06/21/21 1936)  ketorolac (TORADOL) 30 MG/ML injection 30 mg (30 mg Intramuscular Given 06/21/21 1936)  ondansetron (ZOFRAN) injection 4 mg (4 mg Intravenous Given 06/21/21 2158)  sodium chloride 0.9 % bolus 1,000 mL (0 mLs Intravenous Stopped 06/21/21 2326)    ED Course  I have reviewed the triage vital signs and the nursing notes.  Pertinent labs & imaging results that were available during my care of the patient were reviewed by me and considered in my medical decision making (see chart for details).  Clinical Course as of 06/21/21 2356  Nancy Fetter Jun 21, 2021  2108 She states that she has had no relief from her vomiting and abdominal pain with the previously administered Compazine and Toradol.  She states her abdominal pain is severe. [EW]    Clinical Course User Index [EW] Daleen Bo, MD   MDM Rules/Calculators/A&P                           Patient Vitals for the past 24 hrs:  BP Temp Temp src Pulse Resp SpO2 Height Weight  06/21/21 2300 (!) 160/100 -- -- 78 18 99 % -- --  06/21/21 2203 -- -- -- -- 18 -- -- --  06/21/21 2200 (!) 156/84 -- -- 74 18 96 % -- --  06/21/21 2130 -- -- -- 83 -- 100 % -- --  06/21/21 2115 -- -- -- 84 -- 99 % -- --  06/21/21 2100 -- -- -- 85 -- 100 % -- --  06/21/21 2045 -- -- -- 89 -- 100 % -- --  06/21/21 2030 (!) 163/84 -- -- 74 20 100 % -- --  06/21/21 2015 (!) 163/90 -- -- 75 -- 100 % -- --  06/21/21 2000 (!) 172/91 -- -- 76 -- 100 % -- --  06/21/21 1945 (!) 167/104 -- -- 83 18 100 % -- --  06/21/21 1930 (!) 162/91 -- -- 80 -- 99 % -- --  06/21/21 1915 (!) 147/81 -- -- 79 -- 99 % -- --  06/21/21 1900 (!) 160/133 --  -- 86 -- 99 % -- --  06/21/21 1855 -- -- -- -- -- -- _0  (1.753 m) 65.8 kg  06/21/21 1853 (!) 168/107 98.1 F (36.7 C) Oral 88 16 100 % -- --    11:48 PM Reevaluation with update and discussion. After initial assessment and treatment, an updated evaluation reveals she states that her symptoms are almost completely resolved.  I discussed the findings with her and answered all questions. Daleen Bo   Medical Decision Making:  This patient is presenting for evaluation of abdominal pain and vomiting, which does require a range of treatment options, and is a complaint that involves a high risk of morbidity and mortality. The differential diagnoses include hyperemesis cannabinoid syndrome, nonspecific vomiting, nonspecific intestinal disorder. I decided to review old records, and in summary middle-aged female presenting with recurrent and similar symptoms, numerous times over the last several months.  She has had multiple images with CT over the last 2 months.  She presented today after becoming acutely ill at work.  She has had cholecystectomy, 2 months ago.  She has history of GERD and colitis as well as diabetes.  No prior diagnosis of gastric immobility.  I did not require additional historical information from anyone.  Clinical Laboratory Tests Ordered, included CBC, Metabolic panel, Urinalysis, and urine drug screen, lipase, viral panel . Review indicates normal except glucose high, total protein high, UDS positive for cocaine and THC.  Urinalysis with nonspecific abnormalities including elevated glucose, hemoglobin and ketones.    Critical Interventions-clinical evaluation, laboratory testing, medication treatment, observation and reassessment  After These Interventions, the Patient was reevaluated and was found with signs and symptoms of hyperemesis cannabinoid syndrome.  Also cocaine present and urinalysis indicating polysubstance abuse.  CRITICAL CARE-yes Performed by: Daleen Bo  Nursing Notes Reviewed/ Care Coordinated Applicable Imaging Reviewed Interpretation of Laboratory Data incorporated into ED treatment  The patient appears reasonably screened and/or stabilized for discharge and I doubt any other medical condition or other [facility] requiring further screening, evaluation, or treatment in the ED at this time prior to discharge.  Plan: Home Medications-continue usual; Home Treatments-avoid marijuana and cocaine; return here if the recommended treatment, does not improve the symptoms; Recommended follow up-PCP, as needed     Final Clinical Impression(s) / ED Diagnoses Final diagnoses:  Cocaine abuse ([place])  Cannabinoid hyperemesis syndrome    Rx / DC Orders ED Discharge Orders          Ordered    ondansetron (ZOFRAN) 8 MG tablet  Every 8 hours PRN        06/21/21 2355             Daleen Bo, MD 06/21/21 2356

## 2021-06-21 NOTE — Discharge Instructions (Addendum)
Stop using cocaine and marijuana.  This will help you get better quicker.  Marijuana especially, tends to cause vomiting.  Start with a clear liquid diet then gradually advance to regular foods after a day or 2.  See your doctor if not better in 3 to 4 days.

## 2021-06-24 ENCOUNTER — Other Ambulatory Visit: Payer: Self-pay

## 2021-06-24 ENCOUNTER — Emergency Department (HOSPITAL_COMMUNITY): Payer: Medicaid Other

## 2021-06-24 ENCOUNTER — Emergency Department (HOSPITAL_COMMUNITY)
Admission: EM | Admit: 2021-06-24 | Discharge: 2021-06-25 | Disposition: A | Discharge status: Home / Self Care | Payer: Medicaid Other | Attending: Student | Admitting: Student

## 2021-06-24 DIAGNOSIS — R079 Chest pain, unspecified: Secondary | ICD-10-CM | POA: Insufficient documentation

## 2021-06-24 DIAGNOSIS — R002 Palpitations: Secondary | ICD-10-CM | POA: Insufficient documentation

## 2021-06-24 DIAGNOSIS — R112 Nausea with vomiting, unspecified: Secondary | ICD-10-CM | POA: Diagnosis not present

## 2021-06-24 DIAGNOSIS — Z5321 Procedure and treatment not carried out due to patient leaving prior to being seen by health care provider: Secondary | ICD-10-CM | POA: Diagnosis not present

## 2021-06-24 DIAGNOSIS — R0602 Shortness of breath: Secondary | ICD-10-CM | POA: Diagnosis not present

## 2021-06-24 DIAGNOSIS — R0789 Other chest pain: Secondary | ICD-10-CM | POA: Diagnosis not present

## 2021-06-24 LAB — CBC
HCT: 46.5 % — ABNORMAL HIGH (ref 36.0–46.0)
Hemoglobin: 16 g/dL — ABNORMAL HIGH (ref 12.0–15.0)
MCH: 31.4 pg (ref 26.0–34.0)
MCHC: 34.4 g/dL (ref 30.0–36.0)
MCV: 91.2 fL (ref 80.0–100.0)
Platelets: 262 10*3/uL (ref 150–400)
RBC: 5.1 MIL/uL (ref 3.87–5.11)
RDW: 11.6 % (ref 11.5–15.5)
WBC: 9.4 10*3/uL (ref 4.0–10.5)
nRBC: 0 % (ref 0.0–0.2)

## 2021-06-24 LAB — BASIC METABOLIC PANEL
Anion gap: 13 (ref 5–15)
BUN: 13 mg/dL (ref 6–20)
CO2: 23 mmol/L (ref 22–32)
Calcium: 9.2 mg/dL (ref 8.9–10.3)
Chloride: 99 mmol/L (ref 98–111)
Creatinine, Ser: 0.74 mg/dL (ref 0.44–1.00)
GFR, Estimated: >60 mL/min (ref >60)
Glucose, Bld: 195 mg/dL — ABNORMAL HIGH (ref 70–99)
Potassium: 3.6 mmol/L (ref 3.5–5.1)
Sodium: 135 mmol/L (ref 135–145)

## 2021-06-24 LAB — HCG, QUANTITATIVE, PREGNANCY: hCG, Beta Chain, Quant, S: 1 m[IU]/mL (ref <5)

## 2021-06-24 LAB — TROPONIN I (HIGH SENSITIVITY): Troponin I (High Sensitivity): 5 ng/L (ref <18)

## 2021-06-24 NOTE — ED Provider Notes (Cosign Needed)
Emergency Medicine Provider Triage Evaluation Note  [name] , a 45 y.o. female  was evaluated in triage.  Pt complains of chest pain.  She was seen on Sunday for the same.  She feels short of breath, nauseated, is vomiting.  Has not been able to keep any fluids or food down.  Try taking the medicine prescribed that did not help.  Review of Systems  Positive: Chest pain, back pain, shortness of breath, nausea, vomiting Negative: Fevers, chills  Physical Exam  LMP  (LMP Unknown) Comment: neg test Gen:   Awake, distressed.  Resp:  Normal effort  MSK:   Moves extremities without difficulty  Other:    Medical Decision Making  Medically screening exam initiated at 10:36 PM.  Appropriate orders placed.  Rumi R Ricciardi was informed that the remainder of the evaluation will be completed by another provider, this initial triage assessment does not replace that evaluation, and the importance of remaining in the ED until their evaluation is complete.     Theron Arista, PA-C 06/24/21 2237

## 2021-06-24 NOTE — ED Triage Notes (Signed)
Pt arrived to ER via pov for heart palpitations.  Pt states each time she vomits her heart starts to beat faster and skips beats.  Pt has been throwing up since Sunday.  Pt states she has not urinated or had a bm in days.

## 2021-07-15 ENCOUNTER — Other Ambulatory Visit (INDEPENDENT_AMBULATORY_CARE_PROVIDER_SITE_OTHER): Payer: Self-pay | Admitting: Primary Care

## 2021-07-15 NOTE — Telephone Encounter (Signed)
Requested medication (s) are due for refill today: Yes}  Requested medication (s) are on the active medication list: Yes  Last refill:  04/10/21  Future visit scheduled: Yes  Notes to clinic:  Unable to refill per protocol, last refill by another provider. Pt is out of medication     Requested Prescriptions  Pending Prescriptions Disp Refills   insulin isophane & regular human KwikPen (NOVOLIN 70/30 KWIKPEN) (70-30) 100 UNIT/ML KwikPen 15 mL 0    Sig: Inject 8 Units into the skin 2 (two) times daily.      Endocrinology:  Diabetes - Insulins Failed - 07/15/2021  1:20 PM      Failed - HBA1C is between 0 and 7.9 and within 180 days    Hgb A1c MFr Bld  Date Value Ref Range Status  04/07/2021 11.1 (H) 4.8 - 5.6 % Final    Comment:    (NOTE) Pre diabetes:          5.7%-6.4%  Diabetes:              >6.4%  Glycemic control for   <7.0% adults with diabetes           Passed - Valid encounter within last 6 months    Recent Outpatient Visits           2 months ago Uncontrolled type 2 diabetes mellitus with hyperglycemia ([facility])   [facility] Grayce Sessions, NP       Future Appointments             In 1 week Ivonne Andrew, NP [facility]

## 2021-07-15 NOTE — Telephone Encounter (Signed)
Copied from CRM [id number]. Topic: Quick Communication - Rx Refill/Question >> Jul 15, 2021  1:11 PM Jaquita Rector A wrote: Medication: insulin isophane & regular human (NOVOLIN 70/30 FLEXPEN RELION) (70-30) 100 UNIT/ML KwikPen  Per patient she is completely out need medication today please  Has the patient contacted their pharmacy? Yes.   (Agent: If no, request that the patient contact the pharmacy for the refill.) (Agent: If yes, when and what did the pharmacy advise?)  Preferred Pharmacy (with phone number or street name): [id number] - [place] ([place]), [place] - [address]  Phone:  [phone] Fax:  [phone]     Agent: Please be advised that RX refills may take up to 3 business days. We ask that you follow-up with your pharmacy.

## 2021-07-16 ENCOUNTER — Encounter: Payer: Self-pay | Admitting: Gastroenterology

## 2021-07-16 ENCOUNTER — Ambulatory Visit (INDEPENDENT_AMBULATORY_CARE_PROVIDER_SITE_OTHER): Payer: Medicaid Other | Admitting: Gastroenterology

## 2021-07-16 VITALS — BP 88/60 | HR 68 | Ht 69.0 in | Wt 145.0 lb

## 2021-07-16 DIAGNOSIS — K5909 Other constipation: Secondary | ICD-10-CM | POA: Diagnosis not present

## 2021-07-16 DIAGNOSIS — R1084 Generalized abdominal pain: Secondary | ICD-10-CM

## 2021-07-16 DIAGNOSIS — R110 Nausea: Secondary | ICD-10-CM

## 2021-07-16 MED ORDER — ONDANSETRON HCL 4 MG PO TABS: 4.0000 mg | ORAL_TABLET | Freq: Three times a day (TID) | ORAL | 1 refills | Status: DC | PRN

## 2021-07-16 NOTE — Progress Notes (Signed)
[facility] Consult Note:  History: [name] 07/16/2021  Referring provider: Kerin Perna, NP  Reason for consult/chief complaint: Abdominal Pain (C/o left sided abdominal pain "shooting" as well as epigastric abdominal pressure since gallbladder surgery in May. +bloating), Constipation (Constipation since surgery. Patient notes sharp rectal pain after bowel movement. No rectal bleeding. ), and Nausea (Occurs intermittently; states "I havent vomited in a long time"; was given pantoprazole and zofran at the hospital and says this helped, but has since ran out of the medication.)   Subjective  HPI:  This is a 45 year old woman referred by primary care for abdominal pain and nausea. She had multiple ED visits several months ago for abdominal pain and gallstones were discovered.  Eventually this led to inpatient surgical evaluation in early May, and she underwent cholecystectomy.  It also appears she was diagnosed with new onset diabetes at that time and was put on insulin.  She says ever since gallbladder surgery she has had severe constipation, continued abdominal pain and nausea and vomiting.[name] tells me "none of this was a problem before my gallbladder surgery". She has fairly generalized abdominal pain, though sometimes more localized to her "shooting" and spastic in various areas.  She has persistent nausea but no longer vomiting.  She denies heartburn dysphagia or odynophagia.  No particular treatments yet to relieve constipation, she may go over a week between bowel movements.  ROS:  Review of Systems  Constitutional:  Negative for appetite change and unexpected weight change.  HENT:  Negative for mouth sores and voice change.   Eyes:  Negative for pain and redness.  Respiratory:  Negative for cough and shortness of breath.   Cardiovascular:  Negative for chest pain and palpitations.  Genitourinary:  Negative for dysuria and hematuria.   Musculoskeletal:  Negative for arthralgias and myalgias.  Skin:  Negative for pallor and rash.  Neurological:  Negative for weakness and headaches.  Hematological:  Negative for adenopathy.    Past Medical History: Past Medical History:  Diagnosis Date   Acute cholecystitis s/p lap cholecystectomy 04/08/2021 04/07/2021   Cholelithiasis    Diabetes mellitus type 2 in nonobese ([place]) 04/20/2021   GERD (gastroesophageal reflux disease)    Tobacco abuse      Past Surgical History: Past Surgical History:  Procedure Laterality Date   CESAREAN SECTION     x 2   CHOLECYSTECTOMY N/A 04/08/2021   Procedure: LAPAROSCOPIC CHOLECYSTECTOMy;  Surgeon: Coralie Keens, MD;  Location: [facility];  Service: General;  Laterality: N/A;     Family History: Family History  Problem Relation Age of Onset   Hypertension Mother    Colon cancer Neg Hx    Esophageal cancer Neg Hx    Pancreatic cancer Neg Hx    Stomach cancer Neg Hx    Liver disease Neg Hx     Social History: Social History   Socioeconomic History   Marital status: Single    Spouse name: Not on file   Number of children: Not on file   Years of education: Not on file   Highest education level: Not on file  Occupational History   Not on file  Tobacco Use   Smoking status: Every Day    Packs/day: 1.50    Types: Cigarettes   Smokeless tobacco: Never  Vaping Use   Vaping Use: Never used  Substance and Sexual Activity   Alcohol use: Yes    Comment: socially   Drug use: Yes  Types: Marijuana   Sexual activity: Not on file  Other Topics Concern   Not on file  Social History Narrative   Not on file   Social Determinants of Health   Financial Resource Strain: Not on file  Food Insecurity: Not on file  Transportation Needs: Not on file  Physical Activity: Not on file  Stress: Not on file  Social Connections: Not on file   When asked about marijuana cocaine use, she is somewhat evasive but 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 Use up to four times daily as directed. 1 each 0   insulin isophane & regular human (NOVOLIN 70/30 FLEXPEN RELION) (70-30) 100 UNIT/ML KwikPen Inject 8 Units into the skin 2 (two) times daily. 15 mL 0   ondansetron (ZOFRAN) 4 MG tablet Take 1 tablet (4 mg total) by mouth every 8 (eight) hours as needed for nausea or vomiting. 30 tablet 1   No current facility-administered medications for this visit.      ___________________________________________________________________ Objective   Exam:  BP (!) 88/60   Pulse 68   Ht '5\' 9"'  (1.753 m)   Wt 145 lb (65.8 kg)   LMP  (LMP Unknown) Comment: neg test  BMI 21.41 kg/m  Wt Readings from Last 3 Encounters:  07/16/21 145 lb (65.8 kg)  06/24/21 145 lb (65.8 kg)  06/21/21 145 lb (65.8 kg)    General: Well-appearing Eyes: sclera anicteric, no redness ENT: oral mucosa moist without lesions, no cervical or supraclavicular lymphadenopathy CV: RRR without murmur, S1/S2, no JVD, no peripheral edema Resp: clear to auscultation bilaterally, normal RR and effort noted GI: soft, scattered tenderness, with active bowel sounds. No guarding or palpable organomegaly noted. Skin; warm and dry, no rash or jaundice noted Neuro: awake, alert and oriented x 3. Normal gross motor function and fluent speech  Labs:  CBC Latest Ref Rng & Units 06/24/2021 06/21/2021 05/23/2021  WBC 4.0 - 10.5 K/uL 9.4 8.4 10.4  Hemoglobin 12.0 - 15.0 g/dL 16.0(H) 14.1 14.3  Hematocrit 36.0 - 46.0 % 46.5(H) 41.5 42.1  Platelets 150 - 400 K/uL 262 227 245   CMP Latest Ref Rng & Units 06/24/2021 06/21/2021 05/23/2021  Glucose 70 - 99 mg/dL 195(H) 237(H) 129(H)  BUN 6 - 20 mg/dL '13 9 9  ' Creatinine 0.44 - 1.00 mg/dL 0.74 0.54 0.66  Sodium 135 - 145 mmol/L 135  136 135  Potassium 3.5 - 5.1 mmol/L 3.6 3.6 3.7  Chloride 98 - 111 mmol/L 99 100 98  CO2 22 - 32 mmol/L '23 24 25  ' Calcium 8.9 - 10.3 mg/dL 9.2 9.4 9.4  Total Protein 6.5 - 8.1 g/dL - 8.3(H) 8.8(H)  Total Bilirubin 0.3 - 1.2 mg/dL - 0.8 1.3(H)  Alkaline Phos 38 - 126 U/L - 74 98  AST 15 - 41 U/L - 30 40  ALT 0 - 44 U/L - 31 47(H)   Hemoglobin A1c 11.1 on 04/07/2021  Radiologic Studies:  Narrative & Impression CLINICAL DATA:  Abdominal abscess suspected.   EXAM: CT ABDOMEN AND PELVIS WITH CONTRAST   TECHNIQUE: Multidetector CT imaging of the abdomen and pelvis was performed using the standard protocol following bolus administration of intravenous contrast.   CONTRAST:  125m OMNIPAQUE IOHEXOL 300 MG/ML  SOLN   COMPARISON:  Apr 23, 2021   FINDINGS: Lower  chest: No acute abnormality.   Hepatobiliary: Status post cholecystectomy. There is residual soft tissue thickening at the cholecystectomy bed. Focal fatty infiltration of the liver adjacent to the falciform ligament. No biliary ductal dilation.   Pancreas: Unremarkable. No pancreatic ductal dilatation or surrounding inflammatory changes.   Spleen: Normal in size without focal abnormality.   Adrenals/Urinary Tract: Adrenal glands are unremarkable. Kidneys are normal, without renal calculi, focal lesion, or hydronephrosis. Bladder is unremarkable.   Stomach/Bowel: Stomach is within normal limits. Appendix appears normal. No evidence of bowel wall thickening, distention, or inflammatory changes.   Vascular/Lymphatic: No significant vascular findings are present. No enlarged abdominal or pelvic lymph nodes.   Reproductive: Uterus and bilateral adnexa are unremarkable.   Other: Small amount of free fluid in the pelvis, which measures water density. The previously described left rectus abdominus sheath hematoma has resolved.   Musculoskeletal: No acute or significant osseous findings.   IMPRESSION: 1. Status post  cholecystectomy with residual soft tissue thickening at the cholecystectomy bed. This may represent postsurgical changes, however infectious or inflammatory changes cannot be excluded. Overall the soft tissue thickening is mildly improved when compared to Apr 23, 2021. 2. Small amount of free fluid in the pelvis. 3. Interval resolution of the previously described left rectus abdominus sheath hematoma.     Electronically Signed   By: Fidela Salisbury M.D.   On: 05/23/2021 11:54  ________________________________  04/23/2021 CT abdomen pelvis with rectus sheath hematoma  __________________________________________  Narrative & Impression CLINICAL DATA:  Left lower quadrant abdominal pain.   EXAM: CT ABDOMEN AND PELVIS WITHOUT CONTRAST   TECHNIQUE: Multidetector CT imaging of the abdomen and pelvis was performed following the standard protocol without IV contrast.   COMPARISON:  04/07/2021   FINDINGS: Lower chest: No acute abnormality.   Hepatobiliary: Status post cholecystectomy. There is a small volume fluid within the gallbladder fossa adjacent to cholecystectomy clips, nonspecific and likely postoperative. No signs of bile duct dilatation. No focal liver abnormality.   Pancreas: Unremarkable. No pancreatic ductal dilatation or surrounding inflammatory changes.   Spleen: Normal in size without focal abnormality.   Adrenals/Urinary Tract: Normal adrenal glands. No kidney stone or hydronephrosis. Bladder is unremarkable.   Stomach/Bowel: Stomach is nondistended. The appendix is visualized and appears normal. No small bowel wall thickening, inflammation or distension. There is circumferential wall thickening involving the: From the level of the ileocecal valve to the mid descending colon with pericolonic soft tissue stranding compatible with colitis. No signs of bowel obstruction, pneumatosis or bowel perforation.   Vascular/Lymphatic: Aortic atherosclerosis. No  enlarged abdominal or pelvic lymph nodes.   Reproductive: Uterus and bilateral adnexa are unremarkable.   Other: There is a trace amount of free fluid within the dependent portion of the gallbladder. Within the limitations of unenhanced technique there is no focal loculated fluid collections to suggest drainable abscess.   Musculoskeletal: There is abnormal asymmetric thickening of the left rectus abdominus musculature which measures 5.2 x 3.2 by 14.6 cm (volume = 130 cm^3). This is compatible with rectus sheath hematoma, image 61/2.   IMPRESSION: 1. Examination is positive for circumferential wall thickening involving the colon from the level of the ileocecal valve to the mid descending colon compatible with colitis. No signs of bowel obstruction, pneumatosis or bowel perforation. 2. Interval development of left rectus sheath hematoma with a volume of approximately 130 cc. 3. Status post cholecystectomy. Small volume fluid within the gallbladder fossa is nonspecific and compatible with postoperative change. 4. Aortic atherosclerosis.  Aortic Atherosclerosis (ICD10-I70.0).     Electronically Signed   By: Kerby Moors M.D.   On: 04/20/2021 18:38    Other:  During ED visit 06/21/2021, ethanol level negative, urine toxicology positive for cocaine and THC Assessment: Encounter Diagnoses  Name Primary?   Generalized abdominal pain Yes   Chronic constipation    Nausea in adult     Abdominal pain somewhat difficult to characterize, fairly generalized but often more so in the upper abdomen.  Chronic constipation which she says is new in the last several months, though none of this saw to be expected after effects from cholecystectomy.  Therefore she may have additional chronic digestive conditions, also may be a component of cannabis hyperemesis and intermittent cocaine use as well as suboptimally controlled recently diagnosed diabetes.  Plan: Zofran prescribed, she said  it helped when given by the ED but she ran out of it Upper endoscopy.  She was agreeable after discussion of procedure and risks.  The benefits and risks of the planned procedure were described in detail with the patient or (when appropriate) their health care proxy.  Risks were outlined as including, but not limited to, bleeding, infection, perforation, adverse medication reaction leading to cardiac or pulmonary decompensation, pancreatitis (if ERCP).  The limitation of incomplete mucosal visualization was also discussed.  No guarantees or warranties were given.  Colonoscopy also offered but she declined.  She is seeing primary care soon, and hopefully her diabetes management is improved, as this is essential for long-term health and management of her digestive symptoms as well.  Stop using marijuana and cocaine.  Thank you for the courtesy of this consult.  Please call me with any questions or concerns.  Nelida Meuse III  CC: Referring provider noted above

## 2021-07-16 NOTE — Patient Instructions (Addendum)
If you are age 46 or older, your body mass index should be between 23-30. Your Body mass index is 21.41 kg/m. If this is out of the aforementioned range listed, please consider follow up with your Primary Care Provider.  If you are age 25 or younger, your body mass index should be between 19-25. Your Body mass index is 21.41 kg/m. If this is out of the aformentioned range listed, please consider follow up with your Primary Care Provider.   __________________________________________________________  The [facility] providers would like to encourage you to use [facility] to communicate with providers for non-urgent requests or questions.  Due to long hold times on the telephone, sending your provider a message by [facility] may be a faster and more efficient way to get a response.  Please allow 48 business hours for a response.  Please remember that this is for non-urgent requests.   Due to recent changes in healthcare laws, you may see the results of your imaging and laboratory studies on MyChart before your provider has had a chance to review them.  We understand that in some cases there may be results that are confusing or concerning to you. Not all laboratory results come back in the same time frame and the provider may be waiting for multiple results in order to interpret others.  Please give [place] 48 hours in order for your provider to thoroughly review all the results before contacting the office for clarification of your results.    Please start MIralax once a day. Increase to twice a day if needed.   It was a pleasure to see you today!  Thank you for trusting me with your gastrointestinal care!

## 2021-07-18 MED ORDER — NOVOLIN 70/30 FLEXPEN RELION (70-30) 100 UNIT/ML ~~LOC~~ SUPN: 8.0000 [IU] | PEN_INJECTOR | Freq: Two times a day (BID) | SUBCUTANEOUS | 0 refills | Status: AC

## 2021-07-23 ENCOUNTER — Ambulatory Visit (INDEPENDENT_AMBULATORY_CARE_PROVIDER_SITE_OTHER): Payer: Self-pay | Admitting: Nurse Practitioner

## 2021-07-27 ENCOUNTER — Ambulatory Visit (INDEPENDENT_AMBULATORY_CARE_PROVIDER_SITE_OTHER): Payer: Medicaid Other | Admitting: Nurse Practitioner

## 2021-08-11 ENCOUNTER — Ambulatory Visit (AMBULATORY_SURGERY_CENTER): Payer: Medicaid Other | Admitting: Gastroenterology

## 2021-08-11 ENCOUNTER — Other Ambulatory Visit: Payer: Self-pay

## 2021-08-11 ENCOUNTER — Encounter: Payer: Self-pay | Admitting: Gastroenterology

## 2021-08-11 VITALS — BP 121/70 | HR 64 | Temp 97.3°F | Resp 16 | Ht 69.0 in | Wt 145.0 lb

## 2021-08-11 DIAGNOSIS — R112 Nausea with vomiting, unspecified: Secondary | ICD-10-CM | POA: Diagnosis not present

## 2021-08-11 DIAGNOSIS — R1084 Generalized abdominal pain: Secondary | ICD-10-CM | POA: Diagnosis not present

## 2021-08-11 DIAGNOSIS — R110 Nausea: Secondary | ICD-10-CM

## 2021-08-11 DIAGNOSIS — K2970 Gastritis, unspecified, without bleeding: Secondary | ICD-10-CM

## 2021-08-11 DIAGNOSIS — K2950 Unspecified chronic gastritis without bleeding: Secondary | ICD-10-CM | POA: Diagnosis not present

## 2021-08-11 MED ORDER — SODIUM CHLORIDE 0.9 % IV SOLN: 500.0000 mL | Freq: Once | INTRAVENOUS | Status: AC

## 2021-08-11 NOTE — Progress Notes (Signed)
No changes to clinical history since GI office visit on 07/16/21.  The patient is appropriate for an endoscopic procedure in the ambulatory setting.

## 2021-08-11 NOTE — Progress Notes (Signed)
Pt's states no medical or surgical changes since previsit or office visit. 

## 2021-08-11 NOTE — Patient Instructions (Signed)
Please read handouts provided. Continue present medications. Await pathology results. Stop marijuana use. Please check blood glucose levels twice daily before AM and PM meals.   YOU HAD AN ENDOSCOPIC PROCEDURE TODAY AT THE [facility]:   Refer to the procedure report that was given to you for any specific questions about what was found during the examination.  If the procedure report does not answer your questions, please call your gastroenterologist to clarify.  If you requested that your care partner not be given the details of your procedure findings, then the procedure report has been included in a sealed envelope for you to review at your convenience later.  YOU SHOULD EXPECT: Some feelings of bloating in the abdomen. Passage of more gas than usual.  Walking can help get rid of the air that was put into your GI tract during the procedure and reduce the bloating. If you had a lower endoscopy (such as a colonoscopy or flexible sigmoidoscopy) you may notice spotting of blood in your stool or on the toilet paper. If you underwent a bowel prep for your procedure, you may not have a normal bowel movement for a few days.  Please Note:  You might notice some irritation and congestion in your nose or some drainage.  This is from the oxygen used during your procedure.  There is no need for concern and it should clear up in a day or so.  SYMPTOMS TO REPORT IMMEDIATELY:   Following upper endoscopy (EGD)  Vomiting of blood or coffee ground material  New chest pain or pain under the shoulder blades  Painful or persistently difficult swallowing  New shortness of breath  Fever of 100F or higher  Black, tarry-looking stools  For urgent or emergent issues, a gastroenterologist can be reached at any hour by calling [phone]. Do not use MyChart messaging for urgent concerns.    DIET:  We do recommend a small meal at first, but then you may proceed to your regular diet.  Drink plenty  of fluids but you should avoid alcoholic beverages for 24 hours.  ACTIVITY:  You should plan to take it easy for the rest of today and you should NOT DRIVE or use heavy machinery until tomorrow (because of the sedation medicines used during the test).    FOLLOW UP: Our staff will call the number listed on your records 48-72 hours following your procedure to check on you and address any questions or concerns that you may have regarding the information given to you following your procedure. If we do not reach you, we will leave a message.  We will attempt to reach you two times.  During this call, we will ask if you have developed any symptoms of COVID 19. If you develop any symptoms (ie: fever, flu-like symptoms, shortness of breath, cough etc.) before then, please call [phone].  If you test positive for Covid 19 in the 2 weeks post procedure, please call and report this information to [place].    If any biopsies were taken you will be contacted by phone or by letter within the next 1-3 weeks.  Please call us at [phone] if you have not heard about the biopsies in 3 weeks.    SIGNATURES/CONFIDENTIALITY: You and/or your care partner have signed paperwork which will be entered into your electronic medical record.  These signatures attest to the fact that that the information above on your After Visit Summary has been reviewed and is understood.  Full responsibility  of the confidentiality of this discharge information lies with you and/or your care-partner.

## 2021-08-11 NOTE — Op Note (Signed)
[facility] Patient Name: [name] Procedure Date: 08/11/2021 1:24 PM MRN: [id number] Endoscopist: Sherilyn Cooter L. Myrtie Neither , MD Age: 45 Referring MD:  Date of Birth: 25-Jun-1976 Gender: Female Account #: [id number] Procedure:                Upper GI endoscopy Indications:              Generalized abdominal pain, Nausea Medicines:                Monitored Anesthesia Care Procedure:                Pre-Anesthesia Assessment:                           - Prior to the procedure, a History and Physical                            was performed, and patient medications and                            allergies were reviewed. The patient's tolerance of                            previous anesthesia was also reviewed. The risks                            and benefits of the procedure and the sedation                            options and risks were discussed with the patient.                            All questions were answered, and informed consent                            was obtained. Prior Anticoagulants: The patient has                            taken no previous anticoagulant or antiplatelet                            agents. ASA Grade Assessment: II - A patient with                            mild systemic disease. After reviewing the risks                            and benefits, the patient was deemed in                            satisfactory condition to undergo the procedure.                           After obtaining informed consent, the endoscope was  passed under direct vision. Throughout the                            procedure, the patient's blood pressure, pulse, and                            oxygen saturations were monitored continuously. The                            [id number] #[id number] was introduced through the                            mouth, and advanced to the second part of duodenum.                            The upper GI endoscopy  was accomplished without                            difficulty. The patient tolerated the procedure                            well. Scope In: Scope Out: Findings:                 The esophagus was normal.                           The entire examined stomach was normal. Biopsies                            were taken with a cold forceps for histology.                           The cardia and gastric fundus were normal on                            retroflexion.                           The examined duodenum was normal. Complications:            No immediate complications. Estimated Blood Loss:     Estimated blood loss was minimal. Impression:               - Normal esophagus.                           - Normal stomach. Biopsied.                           - Normal examined duodenum. Recommendation:           - Patient has a contact number available for                            emergencies. The signs and symptoms of potential  delayed complications were discussed with the                            patient. Return to normal activities tomorrow.                            Written discharge instructions were provided to the                            patient.                           - Resume previous diet.                           - Continue present medications.                           - Await pathology results.                           - Stop marijuana use.                           Follow up closely with primary care for optimal                            blood sugar control.As we discussed, check your                            glucose at least twice daily before AM and PM meals                            and keep a log to show your PCP.                           Reconsider having the colonoscopy recommended at                            the recent office visit. [name] L. Myrtie Neither, MD 08/11/2021 2:02:26 PM This report has been signed electronically.

## 2021-08-11 NOTE — Progress Notes (Signed)
To PACU, VSS. Report to [email or web address] 

## 2021-08-11 NOTE — Progress Notes (Signed)
Called to room to assist during endoscopic procedure.  Patient ID and intended procedure confirmed with present staff. Received instructions for my participation in the procedure from the performing physician.  

## 2021-08-13 ENCOUNTER — Ambulatory Visit (INDEPENDENT_AMBULATORY_CARE_PROVIDER_SITE_OTHER): Payer: Medicaid Other | Admitting: Primary Care

## 2021-08-13 ENCOUNTER — Telehealth: Payer: Self-pay | Admitting: *Deleted

## 2021-08-13 NOTE — Telephone Encounter (Signed)
Follow up call made. 

## 2021-08-13 NOTE — Telephone Encounter (Signed)
  Follow up Call-  Call back number 08/11/2021  Post procedure Call Back phone  # [phone]  Permission to leave phone message Yes  Some recent data might be hidden     Patient questions:  Message left to call us if necessary. Second call.

## 2021-09-13 ENCOUNTER — Emergency Department (HOSPITAL_COMMUNITY)
Admission: EM | Admit: 2021-09-13 | Discharge: 2021-09-13 | Disposition: A | Discharge status: Home / Self Care | Payer: Medicaid Other | Attending: Emergency Medicine | Admitting: Emergency Medicine

## 2021-09-13 ENCOUNTER — Encounter (HOSPITAL_COMMUNITY): Payer: Self-pay

## 2021-09-13 ENCOUNTER — Emergency Department (HOSPITAL_COMMUNITY): Payer: Medicaid Other

## 2021-09-13 DIAGNOSIS — Z794 Long term (current) use of insulin: Secondary | ICD-10-CM | POA: Insufficient documentation

## 2021-09-13 DIAGNOSIS — E119 Type 2 diabetes mellitus without complications: Secondary | ICD-10-CM | POA: Diagnosis not present

## 2021-09-13 DIAGNOSIS — R1084 Generalized abdominal pain: Secondary | ICD-10-CM | POA: Diagnosis not present

## 2021-09-13 DIAGNOSIS — R112 Nausea with vomiting, unspecified: Secondary | ICD-10-CM | POA: Diagnosis not present

## 2021-09-13 DIAGNOSIS — R110 Nausea: Secondary | ICD-10-CM | POA: Diagnosis not present

## 2021-09-13 DIAGNOSIS — F17210 Nicotine dependence, cigarettes, uncomplicated: Secondary | ICD-10-CM | POA: Insufficient documentation

## 2021-09-13 DIAGNOSIS — Z20822 Contact with and (suspected) exposure to covid-19: Secondary | ICD-10-CM | POA: Insufficient documentation

## 2021-09-13 DIAGNOSIS — D72829 Elevated white blood cell count, unspecified: Secondary | ICD-10-CM | POA: Diagnosis not present

## 2021-09-13 DIAGNOSIS — R6883 Chills (without fever): Secondary | ICD-10-CM | POA: Insufficient documentation

## 2021-09-13 DIAGNOSIS — R1111 Vomiting without nausea: Secondary | ICD-10-CM | POA: Diagnosis not present

## 2021-09-13 DIAGNOSIS — R1110 Vomiting, unspecified: Secondary | ICD-10-CM | POA: Diagnosis not present

## 2021-09-13 DIAGNOSIS — R9431 Abnormal electrocardiogram [ECG] [EKG]: Secondary | ICD-10-CM | POA: Diagnosis not present

## 2021-09-13 DIAGNOSIS — R109 Unspecified abdominal pain: Secondary | ICD-10-CM | POA: Diagnosis not present

## 2021-09-13 LAB — CBC
HCT: 48.1 % — ABNORMAL HIGH (ref 36.0–46.0)
Hemoglobin: 16.3 g/dL — ABNORMAL HIGH (ref 12.0–15.0)
MCH: 30.8 pg (ref 26.0–34.0)
MCHC: 33.9 g/dL (ref 30.0–36.0)
MCV: 90.8 fL (ref 80.0–100.0)
Platelets: 298 10*3/uL (ref 150–400)
RBC: 5.3 MIL/uL — ABNORMAL HIGH (ref 3.87–5.11)
RDW: 12.3 % (ref 11.5–15.5)
WBC: 12.4 10*3/uL — ABNORMAL HIGH (ref 4.0–10.5)
nRBC: 0 % (ref 0.0–0.2)

## 2021-09-13 LAB — URINALYSIS, ROUTINE W REFLEX MICROSCOPIC
Bilirubin Urine: NEGATIVE
Glucose, UA: 50 mg/dL — AB
Hgb urine dipstick: NEGATIVE
Ketones, ur: 20 mg/dL — AB
Nitrite: NEGATIVE
Protein, ur: 100 mg/dL — AB
Specific Gravity, Urine: 1.017 (ref 1.005–1.030)
pH: 5 (ref 5.0–8.0)

## 2021-09-13 LAB — I-STAT BETA HCG BLOOD, ED (MC, WL, AP ONLY): I-stat hCG, quantitative: <5 m[IU]/mL (ref <5)

## 2021-09-13 LAB — COMPREHENSIVE METABOLIC PANEL
ALT: 56 U/L — ABNORMAL HIGH (ref 0–44)
AST: 53 U/L — ABNORMAL HIGH (ref 15–41)
Albumin: 4.6 g/dL (ref 3.5–5.0)
Alkaline Phosphatase: 85 U/L (ref 38–126)
Anion gap: 13 (ref 5–15)
BUN: 18 mg/dL (ref 6–20)
CO2: 23 mmol/L (ref 22–32)
Calcium: 9.8 mg/dL (ref 8.9–10.3)
Chloride: 99 mmol/L (ref 98–111)
Creatinine, Ser: 0.9 mg/dL (ref 0.44–1.00)
GFR, Estimated: >60 mL/min (ref >60)
Glucose, Bld: 181 mg/dL — ABNORMAL HIGH (ref 70–99)
Potassium: 3.9 mmol/L (ref 3.5–5.1)
Sodium: 135 mmol/L (ref 135–145)
Total Bilirubin: 1 mg/dL (ref 0.3–1.2)
Total Protein: 9.3 g/dL — ABNORMAL HIGH (ref 6.5–8.1)

## 2021-09-13 LAB — LIPASE, BLOOD: Lipase: 26 U/L (ref 11–51)

## 2021-09-13 LAB — RESP PANEL BY RT-PCR (FLU A&B, COVID) ARPGX2
Influenza A by PCR: NEGATIVE
Influenza B by PCR: NEGATIVE
SARS Coronavirus 2 by RT PCR: NEGATIVE

## 2021-09-13 MED ORDER — ONDANSETRON HCL 4 MG/2ML IJ SOLN: 4.0000 mg | Freq: Once | INTRAMUSCULAR | Status: DC

## 2021-09-13 MED ORDER — MORPHINE SULFATE (PF) 4 MG/ML IV SOLN
4.0000 mg | Freq: Once | INTRAVENOUS | Status: DC
Start: 2021-09-13 — End: 2021-09-13

## 2021-09-13 MED ORDER — PROMETHAZINE HCL 25 MG PO TABS: 25.0000 mg | ORAL_TABLET | Freq: Three times a day (TID) | ORAL | 0 refills | Status: AC | PRN

## 2021-09-13 MED ORDER — ONDANSETRON HCL 4 MG/2ML IJ SOLN
4.0000 mg | Freq: Once | INTRAMUSCULAR | Status: AC
  Administered 2021-09-13: 4 mg via INTRAVENOUS
  Filled 2021-09-13: qty 2

## 2021-09-13 MED ORDER — SODIUM CHLORIDE 0.9 % IV BOLUS
1000.0000 mL | Freq: Once | INTRAVENOUS | Status: AC
  Administered 2021-09-13: 1000 mL via INTRAVENOUS

## 2021-09-13 MED ORDER — IOHEXOL 350 MG/ML SOLN
80.0000 mL | Freq: Once | INTRAVENOUS | Status: AC | PRN
  Administered 2021-09-13: 80 mL via INTRAVENOUS

## 2021-09-13 MED ORDER — HYDROMORPHONE HCL 1 MG/ML IJ SOLN
1.0000 mg | Freq: Once | INTRAMUSCULAR | Status: AC
  Administered 2021-09-13: 1 mg via INTRAVENOUS
  Filled 2021-09-13: qty 1

## 2021-09-13 NOTE — ED Provider Notes (Signed)
[facility] DEPT Provider Note   CSN: [id number] Arrival date & time: 09/13/21  1308    History Chief Complaint  Patient presents with   Vomiting    [name] is a 45 y.o. female with past medical history significant for GERD, diabetes, prior lap chole, recurrent emesis who presents for evaluation of vomiting.  Patient states she has intermittent emesis since her lap chole on 04/2021.  Followed by [facility] ([facility]), recent EGD 1 month ago without any acute findings.  Vomiting worse over the last 3 days.  She is unable to keep down any p.o. despite ODT Zofran.  No bowel movement in 2 days, typical daily.  Passing flatus. She has no urinary symptoms.  She has had some chills at home without fever.  Some myalgias.  She is a daily marijuana user.  Denies any EtOH, NSAIDs, Monurol per rectum.  No known COVID exposures, cough, chest pain, shortness of breath, flank pain.  She feels generally weak which she attributes to lack of p.o. intake. Rates her pain a 7/10. No HA, numbness, weakness. Denies additional aggravating or alleviating factors.  History obtained from patient and past medical records.  No interpreter used   HPI     Past Medical History:  Diagnosis Date   Acute cholecystitis s/p lap cholecystectomy 04/08/2021 04/07/2021   Cholelithiasis    Diabetes mellitus type 2 in nonobese ([name]) 04/20/2021   GERD (gastroesophageal reflux disease)    Tobacco abuse     Patient Active Problem List   Diagnosis Date Noted   DM (diabetes mellitus), type 2, uncontrolled 04/21/2021   Colitis 04/20/2021   Abdominal pain 04/08/2021   Hypokalemia 04/08/2021   Hyperglycemia 04/08/2021   Dehydration 04/08/2021   GERD (gastroesophageal reflux disease)    Tobacco abuse    Nausea & vomiting    Acute cholecystitis s/p lap cholecystectomy 04/08/2021 04/07/2021   Lumbar radiculopathy 12/02/2020    Past Surgical History:  Procedure Laterality Date   CESAREAN SECTION      x 2   CHOLECYSTECTOMY N/A 04/08/2021   Procedure: LAPAROSCOPIC CHOLECYSTECTOMy;  Surgeon: Coralie Keens, MD;  Location: [facility];  Service: General;  Laterality: N/A;     OB History   No obstetric history on file.     Family History  Problem Relation Age of Onset   Hypertension Mother    Colon cancer Neg Hx    Esophageal cancer Neg Hx    Pancreatic cancer Neg Hx    Stomach cancer Neg Hx    Liver disease Neg Hx    Rectal cancer Neg Hx     Social History   Tobacco Use   Smoking status: Every Day    Packs/day: 0.50    Years: 30.00    Pack years: 15.00    Types: Cigarettes   Smokeless tobacco: Never  Vaping Use   Vaping Use: Never used  Substance Use Topics   Alcohol use: Yes    Comment: socially   Drug use: Yes    Types: Marijuana    Comment: last time a week ago    Home Medications Prior to Admission medications   Medication Sig Start Date End Date Taking? Authorizing Provider  promethazine (PHENERGAN) 25 MG tablet Take 1 tablet (25 mg total) by mouth every 8 (eight) hours as needed for nausea or vomiting. 09/13/21  Yes Gardiner Espana A, PA-C  blood glucose meter kit and supplies KIT Dispense based on patient and insurance preference. Use up to  four times daily as directed. 04/10/21   Little Ishikawa, MD  insulin isophane & regular human KwikPen (NOVOLIN 70/30 KWIKPEN) (70-30) 100 UNIT/ML KwikPen Inject 8 Units into the skin 2 (two) times daily. 07/18/21   Charlott Rakes, MD  ondansetron (ZOFRAN) 4 MG tablet Take 1 tablet (4 mg total) by mouth every 8 (eight) hours as needed for nausea or vomiting. 07/16/21   Doran Stabler, MD    Allergies    Patient has no known allergies.  Review of Systems   Review of Systems  Constitutional: Negative.   HENT: Negative.    Respiratory: Negative.    Cardiovascular: Negative.   Gastrointestinal:  Positive for abdominal pain, nausea and vomiting. Negative for abdominal distention, anal bleeding, blood in stool,  constipation, diarrhea and rectal pain.  Genitourinary: Negative.   Musculoskeletal:  Positive for myalgias. Negative for arthralgias, back pain, gait problem, joint swelling, neck pain and neck stiffness.  Skin: Negative.   Neurological:  Positive for weakness (generalized). Negative for dizziness, tremors, seizures, syncope, speech difficulty, light-headedness, numbness and headaches.  All other systems reviewed and are negative.  Physical Exam Updated Vital Signs BP 106/62   Pulse 70   Temp 98 F (36.7 C) (Oral)   Resp 15   LMP  (LMP Unknown) Comment: neg test  SpO2 99%   Physical Exam Vitals and nursing note reviewed.  Constitutional:      General: She is not in acute distress.    Appearance: She is well-developed. She is not ill-appearing, toxic-appearing or diaphoretic.  HENT:     Head: Normocephalic and atraumatic.     Nose: Nose normal.     Mouth/Throat:     Mouth: Mucous membranes are moist.  Eyes:     Pupils: Pupils are equal, round, and reactive to light.  Cardiovascular:     Rate and Rhythm: Normal rate.     Pulses: Normal pulses.     Heart sounds: Normal heart sounds.  Pulmonary:     Effort: Pulmonary effort is normal. No respiratory distress.     Breath sounds: Normal breath sounds.     Comments: Clear bilaterally, speaks in full sentences without difficulty Abdominal:     General: Bowel sounds are normal. There is no distension.     Palpations: Abdomen is soft.     Tenderness: There is abdominal tenderness. There is guarding. There is no right CVA tenderness, left CVA tenderness or rebound.     Comments: Diffuse tenderness throughout, voluntary guarding  Musculoskeletal:        General: Normal range of motion.     Cervical back: Normal range of motion.     Comments: Compartments soft.  No bony tenderness.  Moves all 4 extremities without difficulty  Skin:    General: Skin is warm and dry.     Capillary Refill: Capillary refill takes less than 2 seconds.      Comments: No rashes or lesions  Neurological:     General: No focal deficit present.     Mental Status: She is alert and oriented to person, place, and time.     Comments: Cranial nerves 2-12 grossly intact  Psychiatric:        Mood and Affect: Mood normal.    ED Results / Procedures / Treatments   Labs (all labs ordered are listed, but only abnormal results are displayed) Labs Reviewed  COMPREHENSIVE METABOLIC PANEL - Abnormal; Notable for the following components:      Result Value  Glucose, Bld 181 (*)    Total Protein 9.3 (*)    AST 53 (*)    ALT 56 (*)    All other components within normal limits  CBC - Abnormal; Notable for the following components:   WBC 12.4 (*)    RBC 5.30 (*)    Hemoglobin 16.3 (*)    HCT 48.1 (*)    All other components within normal limits  URINALYSIS, ROUTINE W REFLEX MICROSCOPIC - Abnormal; Notable for the following components:   Color, Urine AMBER (*)    APPearance HAZY (*)    Glucose, UA 50 (*)    Ketones, ur 20 (*)    Protein, ur 100 (*)    Leukocytes,Ua TRACE (*)    Bacteria, UA RARE (*)    All other components within normal limits  RESP PANEL BY RT-PCR (FLU A&B, COVID) ARPGX2  LIPASE, BLOOD  RAPID URINE DRUG SCREEN, HOSP PERFORMED  I-STAT BETA HCG BLOOD, ED ([facility], [facility], [facility] ONLY)    EKG None  Radiology CT Abdomen Pelvis W Contrast  Result Date: 09/13/2021 CLINICAL DATA:  Abdominal pain, fever, nausea, and vomiting. EXAM: CT ABDOMEN AND PELVIS WITH CONTRAST TECHNIQUE: Multidetector CT imaging of the abdomen and pelvis was performed using the standard protocol following bolus administration of intravenous contrast. CONTRAST:  65m OMNIPAQUE IOHEXOL 350 MG/ML SOLN COMPARISON:  05/23/2021 FINDINGS: Lower chest: The lung bases are clear. Hepatobiliary: Focal low-attenuation lesion in the medial segment left lobe of liver, likely focal fatty infiltration. No change since prior study. Gallbladder is surgically absent. No bile duct  dilatation. Pancreas: Unremarkable. No pancreatic ductal dilatation or surrounding inflammatory changes. Spleen: Normal in size without focal abnormality. Adrenals/Urinary Tract: Adrenal glands are unremarkable. Kidneys are normal, without renal calculi, focal lesion, or hydronephrosis. Bladder is unremarkable. Stomach/Bowel: Stomach is within normal limits. Appendix appears normal. No evidence of bowel wall thickening, distention, or inflammatory changes. Vascular/Lymphatic: Aortic atherosclerosis. No enlarged abdominal or pelvic lymph nodes. Reproductive: Uterus and bilateral adnexa are unremarkable. Other: No abdominal wall hernia or abnormality. No abdominopelvic ascites. Musculoskeletal: No acute or significant osseous findings. IMPRESSION: No acute abnormalities demonstrated in the abdomen or pelvis. No evidence of bowel obstruction or inflammation. Appendix is normal. Electronically Signed   By: WLucienne CapersM.D.   On: 09/13/2021 19:03    Procedures Procedures   Medications Ordered in ED Medications  sodium chloride 0.9 % bolus 1,000 mL (0 mLs Intravenous Stopped 09/13/21 2017)  iohexol (OMNIPAQUE) 350 MG/ML injection 80 mL (80 mLs Intravenous Contrast Given 09/13/21 1829)  HYDROmorphone (DILAUDID) injection 1 mg (1 mg Intravenous Given 09/13/21 1845)  ondansetron (ZOFRAN) injection 4 mg (4 mg Intravenous Given 09/13/21 1844)    ED Course  I have reviewed the triage vital signs and the nursing notes.  Pertinent labs & imaging results that were available during my care of the patient were reviewed by me and considered in my medical decision making (see chart for details).  Here for evaluation of emesis.  History of similar.  Afebrile, nonseptic, not ill-appearing. Having some generalized abd pain.  Patient does appear mildly dehydrated.  She does have history of daily marijuana use however denies any prior history of cannabinoid hyperemesis.  Her heart and lungs are clear.  Her abdomen is  diffusely tender with voluntary guarding however no focal pain.  She is having chills without fever.  No urinary complaints.  No rashes or lesions.  No known sick contacts.  We will plan on labs, imaging and  reassess  Labs and imaging personally reviewed and interpreted:  CBC leukocytosis at 12.4, hgb 16.3 suspect due to hemoconcentration UA neg for infection Lipase 26 CMP mild elevate din AST 53 and ALT 56 Preg neg EKG with QTC 512 CT [facility] without acute abnormality COVID< flu neg  Patient reassessed.  Feels significantly improved.  No emesis since she has been  roomed here in the ED.  Repeat abdominal exam benign.  Discussed labs and imaging.  Will p.o.  Patient reassessed.  Tolerating p.o. intake.  She feels significantly better and was requesting DC home.  I feel is reasonable.  Discussed cessation of marijuana, further management at home and close follow-up with GI.  She is agreeable to  Patient is nontoxic, nonseptic appearing, in no apparent distress.  Patient's pain and other symptoms adequately managed in emergency department.  Fluid bolus given.  Labs, imaging and vitals reviewed.  Patient does not meet the SIRS or Sepsis criteria.  On repeat exam patient does not have a surgical abdomin and there are no peritoneal signs.  No indication of appendicitis, bowel obstruction, bowel perforation, cholecystitis, diverticulitis, PID, TOA, torsion or ectopic pregnancy.  Patient discharged home with symptomatic treatment and given strict instructions for follow-up with their primary care physician.  I have also discussed reasons to return immediately to the ER.  Patient expresses understanding and agrees with plan.     MDM Rules/Calculators/A&P                            Final Clinical Impression(s) / ED Diagnoses Final diagnoses:  Nausea and vomiting, unspecified vomiting type    Rx / DC Orders ED Discharge Orders          Ordered    promethazine (PHENERGAN) 25 MG tablet  Every 8  hours PRN        09/13/21 2100             Dailan Pfalzgraf A, PA-C 09/13/21 2112    Milton Ferguson, MD 09/14/21 1121

## 2021-09-13 NOTE — Discharge Instructions (Signed)
COVID test was negative, CT scan did not show any significant abnormality  Follow-up outpatient as needed  Return for new or worsening symptoms  I have written you for some Phenergan tablets to help with your vomiting.

## 2021-09-13 NOTE — ED Provider Notes (Signed)
Emergency Medicine Provider Triage Evaluation Note  [name] , a 45 y.o. female  was evaluated in triage.  Pt complains of nausea and vomiting x2 days.  She said she is having difficulty keeping anything down.  She was seen at her primary care and they prescribed her Zofran, this is no longer helping her.  She reports feeling intermittently hot and cold at home, but has not taken her temperature.  Review of Systems  Positive: Nausea, vomiting, fever Negative: Chest pain, shortness of breath, abdominal pain, diarrhea, dysuria, cough, congestion  Physical Exam  LMP  (LMP Unknown) Comment: neg test Gen:   Awake, no distress   Resp:  Normal effort  MSK:   Moves extremities without difficulty  Other:    Medical Decision Making  Medically screening exam initiated at 1:23 PM.  Appropriate orders placed.  [name] was informed that the remainder of the evaluation will be completed by another provider, this initial triage assessment does not replace that evaluation, and the importance of remaining in the ED until their evaluation is complete.     [name] 09/13/21 1325    Maia Plan, MD 09/14/21 [phone]

## 2021-09-13 NOTE — ED Notes (Signed)
Patient transported to CT 

## 2021-09-13 NOTE — ED Triage Notes (Signed)
Pt presents with c/o n/v for 2 days. Pt does have some GI hx. Pt has been given an rx with Zofran but with no relief.

## 2021-09-14 ENCOUNTER — Telehealth: Payer: Self-pay

## 2021-09-14 NOTE — Telephone Encounter (Signed)
Transition Care Management Unsuccessful Follow-up Telephone Call  Date of discharge and from where:  09/13/2021-[facility]   Attempts:  1st Attempt  Reason for unsuccessful TCM follow-up call:  Left voice message

## 2021-09-15 NOTE — Telephone Encounter (Signed)
Transition Care Management Unsuccessful Follow-up Telephone Call  Date of discharge and from where:  09/13/2021 from [facility]  Attempts:  2nd Attempt  Reason for unsuccessful TCM follow-up call:  Left voice message

## 2021-09-16 NOTE — Telephone Encounter (Signed)
Transition Care Management Unsuccessful Follow-up Telephone Call  Date of discharge and from where:  09/13/2021 from [facility]  Attempts:  3rd Attempt  Reason for unsuccessful TCM follow-up call:  Unable to reach patient

## 2021-09-24 ENCOUNTER — Ambulatory Visit: Payer: Medicaid Other | Admitting: Gastroenterology

## 2022-02-17 ENCOUNTER — Emergency Department (HOSPITAL_COMMUNITY): Payer: Medicaid Other

## 2022-02-17 ENCOUNTER — Emergency Department (HOSPITAL_COMMUNITY)
Admission: EM | Admit: 2022-02-17 | Discharge: 2022-02-17 | Disposition: A | Discharge status: Home / Self Care | Payer: Medicaid Other | Attending: Emergency Medicine | Admitting: Emergency Medicine

## 2022-02-17 ENCOUNTER — Encounter (HOSPITAL_COMMUNITY): Payer: Self-pay | Admitting: Emergency Medicine

## 2022-02-17 ENCOUNTER — Other Ambulatory Visit: Payer: Self-pay

## 2022-02-17 DIAGNOSIS — A084 Viral intestinal infection, unspecified: Secondary | ICD-10-CM | POA: Insufficient documentation

## 2022-02-17 DIAGNOSIS — E119 Type 2 diabetes mellitus without complications: Secondary | ICD-10-CM | POA: Diagnosis not present

## 2022-02-17 DIAGNOSIS — I10 Essential (primary) hypertension: Secondary | ICD-10-CM | POA: Diagnosis not present

## 2022-02-17 DIAGNOSIS — R0602 Shortness of breath: Secondary | ICD-10-CM | POA: Diagnosis not present

## 2022-02-17 DIAGNOSIS — E876 Hypokalemia: Secondary | ICD-10-CM | POA: Diagnosis not present

## 2022-02-17 DIAGNOSIS — R519 Headache, unspecified: Secondary | ICD-10-CM | POA: Diagnosis not present

## 2022-02-17 DIAGNOSIS — R1084 Generalized abdominal pain: Secondary | ICD-10-CM

## 2022-02-17 DIAGNOSIS — Z20822 Contact with and (suspected) exposure to covid-19: Secondary | ICD-10-CM | POA: Insufficient documentation

## 2022-02-17 DIAGNOSIS — R42 Dizziness and giddiness: Secondary | ICD-10-CM | POA: Diagnosis not present

## 2022-02-17 DIAGNOSIS — I700 Atherosclerosis of aorta: Secondary | ICD-10-CM | POA: Diagnosis not present

## 2022-02-17 DIAGNOSIS — R109 Unspecified abdominal pain: Secondary | ICD-10-CM | POA: Diagnosis not present

## 2022-02-17 DIAGNOSIS — R0600 Dyspnea, unspecified: Secondary | ICD-10-CM | POA: Diagnosis not present

## 2022-02-17 DIAGNOSIS — R110 Nausea: Secondary | ICD-10-CM | POA: Diagnosis not present

## 2022-02-17 LAB — CBC WITH DIFFERENTIAL/PLATELET
Abs Immature Granulocytes: 0.07 10*3/uL (ref 0.00–0.07)
Basophils Absolute: 0 10*3/uL (ref 0.0–0.1)
Basophils Relative: 0 %
Eosinophils Absolute: 0 10*3/uL (ref 0.0–0.5)
Eosinophils Relative: 0 %
HCT: 49.6 % — ABNORMAL HIGH (ref 36.0–46.0)
Hemoglobin: 17 g/dL — ABNORMAL HIGH (ref 12.0–15.0)
Immature Granulocytes: 1 %
Lymphocytes Relative: 23 %
Lymphs Abs: 2.4 10*3/uL (ref 0.7–4.0)
MCH: 31.7 pg (ref 26.0–34.0)
MCHC: 34.3 g/dL (ref 30.0–36.0)
MCV: 92.4 fL (ref 80.0–100.0)
Monocytes Absolute: 0.5 10*3/uL (ref 0.1–1.0)
Monocytes Relative: 5 %
Neutro Abs: 7.4 10*3/uL (ref 1.7–7.7)
Neutrophils Relative %: 71 %
Platelets: 230 10*3/uL (ref 150–400)
RBC: 5.37 MIL/uL — ABNORMAL HIGH (ref 3.87–5.11)
RDW: 12.1 % (ref 11.5–15.5)
WBC: 10.4 10*3/uL (ref 4.0–10.5)
nRBC: 0 % (ref 0.0–0.2)

## 2022-02-17 LAB — I-STAT BETA HCG BLOOD, ED (MC, WL, AP ONLY): I-stat hCG, quantitative: <5 m[IU]/mL (ref <5)

## 2022-02-17 LAB — COMPREHENSIVE METABOLIC PANEL
ALT: 41 U/L (ref 0–44)
AST: 40 U/L (ref 15–41)
Albumin: 4.6 g/dL (ref 3.5–5.0)
Alkaline Phosphatase: 71 U/L (ref 38–126)
Anion gap: 15 (ref 5–15)
BUN: 19 mg/dL (ref 6–20)
CO2: 24 mmol/L (ref 22–32)
Calcium: 9.5 mg/dL (ref 8.9–10.3)
Chloride: 95 mmol/L — ABNORMAL LOW (ref 98–111)
Creatinine, Ser: 0.78 mg/dL (ref 0.44–1.00)
GFR, Estimated: >60 mL/min (ref >60)
Glucose, Bld: 203 mg/dL — ABNORMAL HIGH (ref 70–99)
Potassium: 3.2 mmol/L — ABNORMAL LOW (ref 3.5–5.1)
Sodium: 134 mmol/L — ABNORMAL LOW (ref 135–145)
Total Bilirubin: 1 mg/dL (ref 0.3–1.2)
Total Protein: 8.9 g/dL — ABNORMAL HIGH (ref 6.5–8.1)

## 2022-02-17 LAB — RESP PANEL BY RT-PCR (FLU A&B, COVID) ARPGX2
Influenza A by PCR: NEGATIVE
Influenza B by PCR: NEGATIVE
SARS Coronavirus 2 by RT PCR: NEGATIVE

## 2022-02-17 LAB — LIPASE, BLOOD: Lipase: 25 U/L (ref 11–51)

## 2022-02-17 MED ORDER — DICYCLOMINE HCL 20 MG PO TABS: 20.0000 mg | ORAL_TABLET | Freq: Two times a day (BID) | ORAL | 0 refills | Status: AC

## 2022-02-17 MED ORDER — ONDANSETRON HCL 4 MG/2ML IJ SOLN
4.0000 mg | Freq: Once | INTRAMUSCULAR | Status: AC
  Administered 2022-02-17: 4 mg via INTRAVENOUS
  Filled 2022-02-17: qty 2

## 2022-02-17 MED ORDER — HYDROMORPHONE HCL 1 MG/ML IJ SOLN
1.0000 mg | Freq: Once | INTRAMUSCULAR | Status: AC
  Administered 2022-02-17: 1 mg via INTRAVENOUS
  Filled 2022-02-17: qty 1

## 2022-02-17 MED ORDER — SODIUM CHLORIDE 0.9 % IV BOLUS
1000.0000 mL | Freq: Once | INTRAVENOUS | Status: AC
Start: 2022-02-17 — End: 2022-02-17
  Administered 2022-02-17: 1000 mL via INTRAVENOUS

## 2022-02-17 MED ORDER — POTASSIUM CHLORIDE 20 MEQ PO PACK
40.0000 meq | PACK | Freq: Once | ORAL | Status: AC
  Administered 2022-02-17: 40 meq via ORAL
  Filled 2022-02-17: qty 2

## 2022-02-17 MED ORDER — MORPHINE SULFATE (PF) 4 MG/ML IV SOLN
4.0000 mg | Freq: Once | INTRAVENOUS | Status: DC
  Filled 2022-02-17: qty 1

## 2022-02-17 MED ORDER — SODIUM CHLORIDE 0.9 % IV BOLUS
1000.0000 mL | Freq: Once | INTRAVENOUS | Status: AC
  Administered 2022-02-17: 1000 mL via INTRAVENOUS

## 2022-02-17 MED ORDER — POTASSIUM CHLORIDE 20 MEQ PO PACK
40.0000 meq | PACK | Freq: Two times a day (BID) | ORAL | Status: DC
Start: 2022-02-17 — End: 2022-02-17

## 2022-02-17 MED ORDER — ONDANSETRON 4 MG PO TBDP: 4.0000 mg | ORAL_TABLET | Freq: Three times a day (TID) | ORAL | 0 refills | Status: AC | PRN

## 2022-02-17 MED ORDER — IOHEXOL 300 MG/ML  SOLN
100.0000 mL | Freq: Once | INTRAMUSCULAR | Status: AC | PRN
  Administered 2022-02-17: 100 mL via INTRAVENOUS

## 2022-02-17 NOTE — ED Provider Notes (Addendum)
?[facility] DEPT ?Provider Note ? ? ?CSN: [id number] ?Arrival date & time: 02/17/22  1236 ? ?  ? ?History ? ?No chief complaint on file. ? ? ?[name] is a 46 y.o. female. ? ?46 year old female presents today for evaluation of 3-day duration of body aches, nausea, vomiting, diarrhea, abdominal pain.  She states today she developed shortness of breath, and dizziness along with cross side.  She endorses double vision, but denies blurry vision, or other visual change.  She denies chest pain, palpitations, radiation of the chest pain.  Reports improvement in her diarrhea.  Abdominal pain is diffuse.  Denies headache.  ? ?The history is provided by the patient. No language interpreter was used.  ? ?  ? ?Home Medications ?Prior to Admission medications   ?Medication Sig Start Date End Date Taking? Authorizing Provider  ?insulin isophane & regular human KwikPen (NOVOLIN 70/30 KWIKPEN) (70-30) 100 UNIT/ML KwikPen Inject 8 Units into the skin 2 (two) times daily. 07/18/21  Yes Charlott Rakes, MD  ?Probiotic Product (ENVIVE) CAPS Take 1 capsule by mouth daily.   Yes [provider]  ?blood glucose meter kit and supplies KIT Dispense based on patient and insurance preference. Use up to four times daily as directed. 04/10/21   Little Ishikawa, MD  ?ondansetron (ZOFRAN) 4 MG tablet Take 1 tablet (4 mg total) by mouth every 8 (eight) hours as needed for nausea or vomiting. ?Patient not taking: Reported on 02/17/2022 07/16/21   Doran Stabler, MD  ?promethazine (PHENERGAN) 25 MG tablet Take 1 tablet (25 mg total) by mouth every 8 (eight) hours as needed for nausea or vomiting. ?Patient not taking: Reported on 02/17/2022 09/13/21   Henderly, Britni A, PA-C  ?   ? ?Allergies    ?Patient has no known allergies.   ? ?Review of Systems   ?Review of Systems  ?Constitutional:  Negative for activity change, chills and fever.  ?Eyes:  Positive for visual disturbance.  ?Respiratory:  Positive  for shortness of breath. Negative for cough.   ?Cardiovascular:  Negative for chest pain.  ?Gastrointestinal:  Positive for abdominal pain, diarrhea, nausea and vomiting. Negative for abdominal distention.  ?Genitourinary:  Negative for dysuria.  ?Neurological:  Negative for weakness, light-headedness and headaches.  ?All other systems reviewed and are negative. ? ?Physical Exam ?Updated Vital Signs ?BP (!) 168/83 (BP Location: Left Arm)   Pulse 75   Temp 97.8 ?F (36.6 ?C) (Oral)   Resp 16   LMP  (LMP Unknown) Comment: neg test  SpO2 100%  ?Physical Exam ?Vitals and nursing note reviewed.  ?Constitutional:   ?   General: She is not in acute distress. ?   Appearance: Normal appearance. She is not ill-appearing.  ?HENT:  ?   Head: Normocephalic and atraumatic.  ?   Nose: Nose normal.  ?Eyes:  ?   General: No scleral icterus. ?   Extraocular Movements: Extraocular movements intact.  ?   Conjunctiva/sclera: Conjunctivae normal.  ?Cardiovascular:  ?   Rate and Rhythm: Normal rate and regular rhythm.  ?   Pulses: Normal pulses.  ?   Heart sounds: Normal heart sounds.  ?Pulmonary:  ?   Effort: Pulmonary effort is normal. No respiratory distress.  ?   Breath sounds: Normal breath sounds. No wheezing or rales.  ?Abdominal:  ?   General: There is no distension.  ?   Tenderness: There is abdominal tenderness. There is guarding. There is no right CVA tenderness or  left CVA tenderness.  ?Musculoskeletal:     ?   General: Normal range of motion.  ?   Cervical back: Normal range of motion.  ?   Right lower leg: No edema.  ?   Left lower leg: No edema.  ?Skin: ?   General: Skin is warm and dry.  ?Neurological:  ?   General: No focal deficit present.  ?   Mental Status: She is alert. Mental status is at baseline.  ?   Comments: Patient alert and oriented x3.  EOMs intact.  Finger-to-nose intact.  5/5 strength in bilateral upper extremities and lower extremities.  Full range of motion in bilateral upper and lower extremities.   ? ? ?ED Results / Procedures / Treatments   ?Labs ?(all labs ordered are listed, but only abnormal results are displayed) ?Labs Reviewed  ?CBC WITH DIFFERENTIAL/PLATELET - Abnormal; Notable for the following components:  ?    Result Value  ? RBC 5.37 (*)   ? Hemoglobin 17.0 (*)   ? HCT 49.6 (*)   ? All other components within normal limits  ?COMPREHENSIVE METABOLIC PANEL - Abnormal; Notable for the following components:  ? Sodium 134 (*)   ? Potassium 3.2 (*)   ? Chloride 95 (*)   ? Glucose, Bld 203 (*)   ? Total Protein 8.9 (*)   ? All other components within normal limits  ?RESP PANEL BY RT-PCR (FLU A&B, COVID) ARPGX2  ?LIPASE, BLOOD  ?URINALYSIS, ROUTINE W REFLEX MICROSCOPIC  ?I-STAT BETA HCG BLOOD, ED ([facility], [facility], [facility] ONLY)  ? ? ?EKG ?None ? ?Radiology ?DG Chest Portable 1 View ? ?Result Date: 02/17/2022 ?CLINICAL DATA:  Dyspnea. EXAM: PORTABLE CHEST 1 VIEW COMPARISON:  06/24/2021 FINDINGS: Chronic linear densities in the right lower chest are suggestive for mild scarring. Otherwise, both lungs are clear. Heart and mediastinum are within normal limits. Trachea is midline. No acute bone abnormality. IMPRESSION: No active disease. Electronically Signed   By: Markus Daft M.D.   On: 02/17/2022 13:46   ? ?Procedures ?Procedures  ? ? ?Medications Ordered in ED ?Medications  ?HYDROmorphone (DILAUDID) injection 1 mg (has no administration in time range)  ?sodium chloride 0.9 % bolus 1,000 mL (1,000 mLs Intravenous New Bag/Given 02/17/22 1339)  ?ondansetron [facility]) injection 4 mg (4 mg Intravenous Given 02/17/22 1337)  ? ? ?ED Course/ Medical Decision Making/ A&P ?Clinical Course as of 02/17/22 1656  ?Wed Feb 17, 2022  ?1602 CBC without leukocytosis or anemia.  CMP with potassium 3.2, preserved renal function, lipase within normal limits.  Respiratory panel negative.  Chest x-ray negative.  Imaging pending.  Patient reports significant improvement in her symptoms following pain medication and IV hydration. [AA]  ?  ?Clinical  Course User Index ?Maudie Mercury, PA-C  ? ?                        ?Medical Decision Making ?Amount and/or Complexity of Data Reviewed ?Labs: ordered. ?Radiology: ordered. ? ?Risk ?Prescription drug management. ? ? ?Medical Decision Making / ED Course ? ? ?This patient presents to the ED for concern of abdominal pain, nausea, vomiting, shortness of, this involves an extensive number of treatment options, and is a complaint that carries with it a high risk of complications and morbidity.  The differential diagnosis includes viral gastroenteritis, appendicitis, cholecystitis, pancreatitis, UTI, pneumonia ? ?MDM: ?46 year old female presents today for evaluation of shortness of breath along with abdominal pain, vomiting, and diarrhea.  Symptoms  have been ongoing for 3 days.  She also reports dizziness and noticing double vision since today.  She denies headache, or other visual change.  Neurological exam is nonfocal.  She is without fever, chills, other URI symptoms.  Patient has diffuse abdominal tenderness with guarding as well.  Without flank tenderness.  Will evaluate with CT head, CT abdomen pelvis with contrast, CMP, CBC, lipase, respiratory panel.  Will give Zofran, pain medication, and IV hydration. ?Patient remains with improved symptoms following pain medication and IV hydration.  Labs reassuring with the exception of mild hypokalemia potassium of 3.2.  CT abdomen pelvis without acute intra-abdominal process.  CT head without acute intracranial process.  Patient reports improvement in her dizziness.  Patient likely has viral gastroenteritis which resulted in dehydration and the abdominal discomfort.  Neurological exam without focal deficits.  Pupils are symmetrical. P.o. repletion provided prior to discharge.  Discussed importance of follow-up with PCP for reevaluation and repeat potassium check.  We will provide patient with Zofran and Bentyl for symptomatic management.  Return precautions discussed.   Patient voices understanding and is in agreement with plan. ? ?Lab Tests: ?-I ordered, reviewed, and interpreted labs.   ?The pertinent results include:   ?Labs Reviewed  ?CBC WITH DIFFERENTIAL/PLATELET - Abnorma

## 2022-02-17 NOTE — Discharge Instructions (Addendum)
Your work-up today was reassuring and did not point to anything concerning as a cause of your abdominal pain, nausea, vomiting, or dizziness.  This is likely a result of viral gastroenteritis.  You more likely lightheaded and having balance issues because of dehydration.  You received pain medication and IV fluids in the emergency room with significant improvement in your symptoms.  Your CT scan did not show anything concerning in the abdomen.  Chest x-ray did not show anything concerning.  Recommend you follow-up with your primary care provider to ensure you are improving and you can have your potassium rechecked..  If you have any worsening symptoms please return to the emergency room. ?

## 2022-02-17 NOTE — ED Triage Notes (Signed)
Pt BIB EMS from home, c/o abdominal pain and nausea since Monday. A&O x4, Lives above mom, stumbled down stairs to moms apartment for help, c/o cant find balance. Eyes are crossed, mom said this is a new symptom. Pt endorses marijuana use this AM, pulled IV out d/t anxiety. Given 4 mg Zofran IM ? ?BP 176/86 ?P 86 ?spO2 99% RA ?CBG 218 ?

## 2022-02-18 ENCOUNTER — Telehealth: Payer: Self-pay

## 2022-02-18 NOTE — Telephone Encounter (Signed)
Transition Care Management Unsuccessful Follow-up Telephone Call ? ?Date of discharge and from where:  3/15/20023-[facility]  ? ?Attempts:  1st Attempt ? ?Reason for unsuccessful TCM follow-up call:  Unable to reach patient ? ?  ?

## 2022-02-19 NOTE — Telephone Encounter (Signed)
Transition Care Management Unsuccessful Follow-up Telephone Call ? ?Date of discharge and from where:  02/17/2022-[facility] ? ?Attempts:  2nd Attempt ? ?Reason for unsuccessful TCM follow-up call:  Unable to reach patient ? ?  ?

## 2022-02-22 NOTE — Telephone Encounter (Signed)
Transition Care Management Unsuccessful Follow-up Telephone Call ? ?Date of discharge and from where:  02/17/2022-[place]  ? ?Attempts:  3rd Attempt ? ?Reason for unsuccessful TCM follow-up call:  Unable to reach patient ? ?  ?

## 2022-06-11 IMAGING — CT CT ABD-PELV W/ CM
3 of 5 series · 17 of 46 positions shown, 19 images · IV contrast (omnipaque)
Comparison: 05/23/2021

CLINICAL DATA: Abdominal pain, fever, nausea, and vomiting.

EXAM:
CT ABDOMEN AND PELVIS WITH CONTRAST
TECHNIQUE: Multidetector CT imaging of the abdomen and pelvis was performed
using the standard protocol following bolus administration of
intravenous contrast.
CONTRAST:  80mL OMNIPAQUE IOHEXOL 350 MG/ML SOLN

[Series 2: axial st · axial · 0.74mm/px · z∈[-644,-274]mm · 12 of 88 slices shown, 14 images]
[im 7/88  soft-tissue]
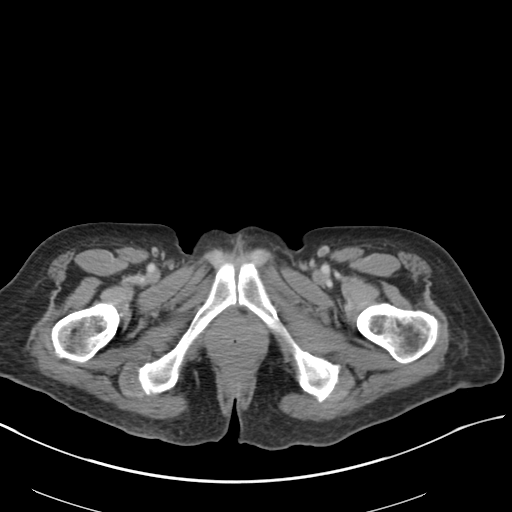
[im 7/88  bone]
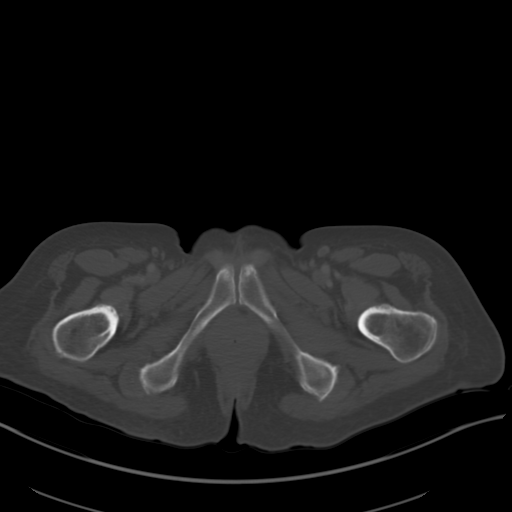
[im 14/88  soft-tissue]
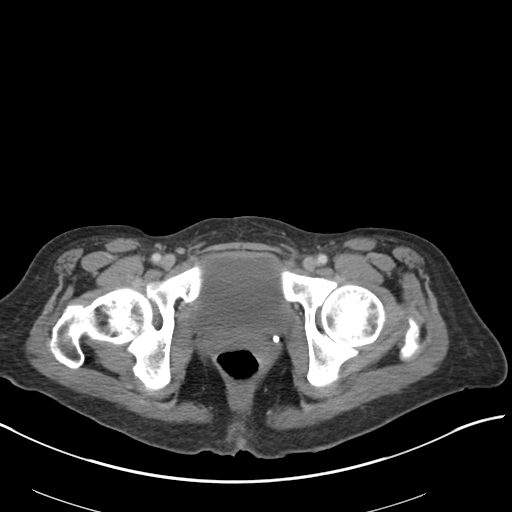
[im 21/88  soft-tissue]
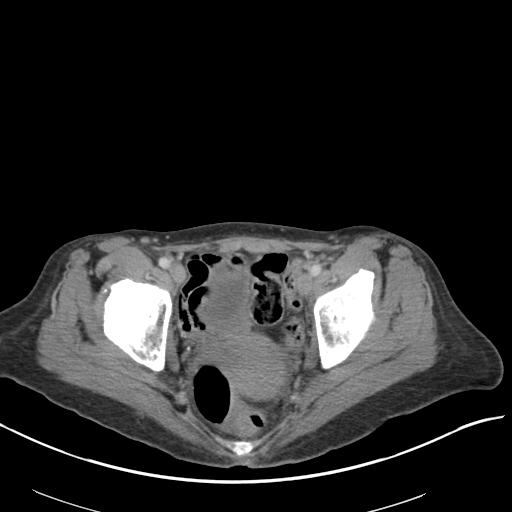
[im 27/88  soft-tissue]
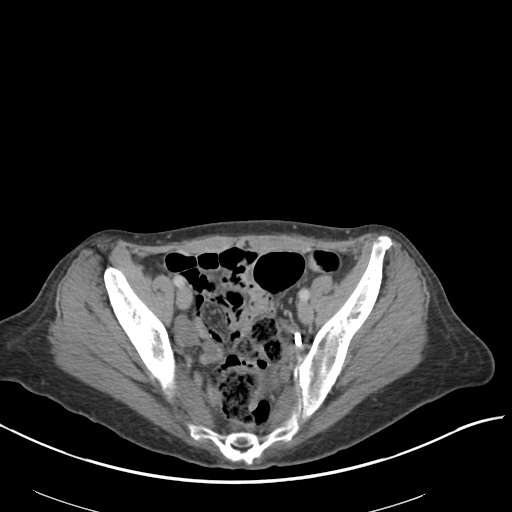
[im 34/88  soft-tissue]
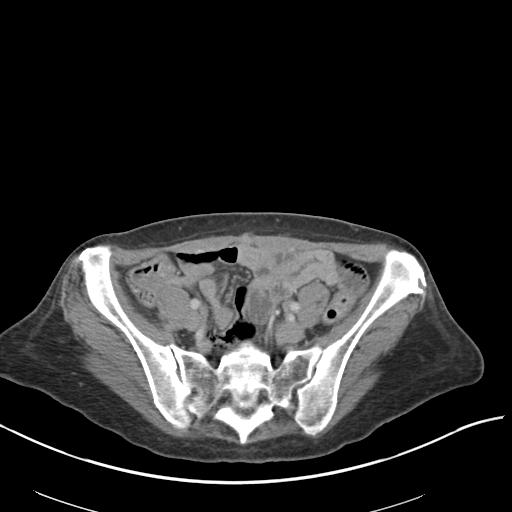
[im 41/88  soft-tissue]
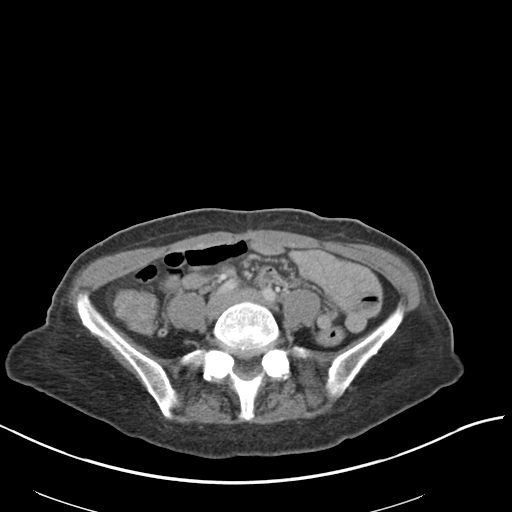
[im 47/88  soft-tissue]
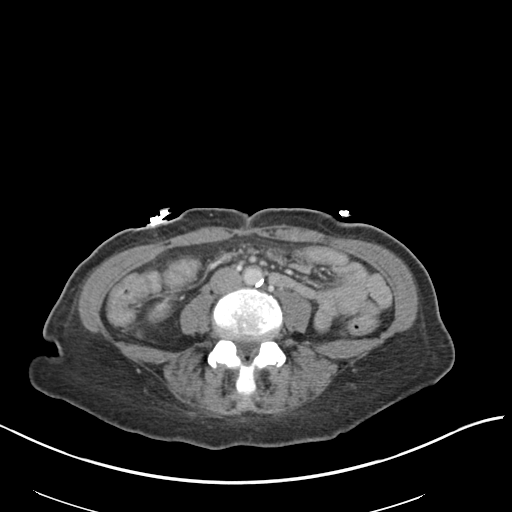
[im 54/88  soft-tissue]
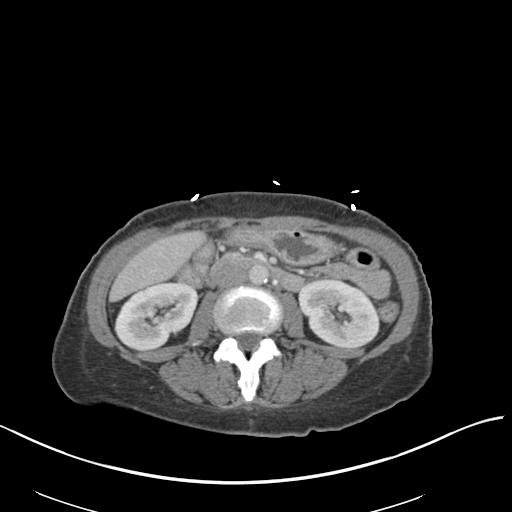
[im 61/88  soft-tissue]
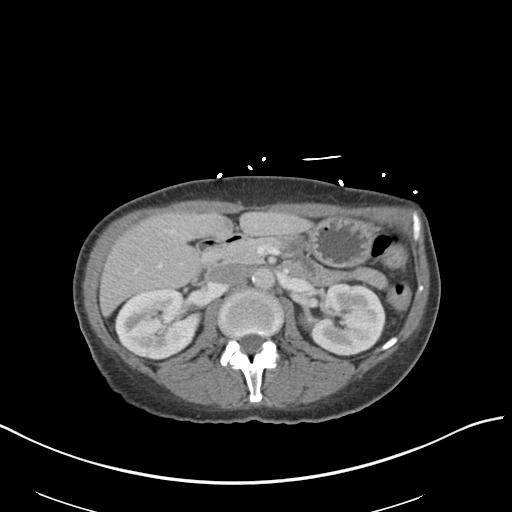
[im 61/88  bone]
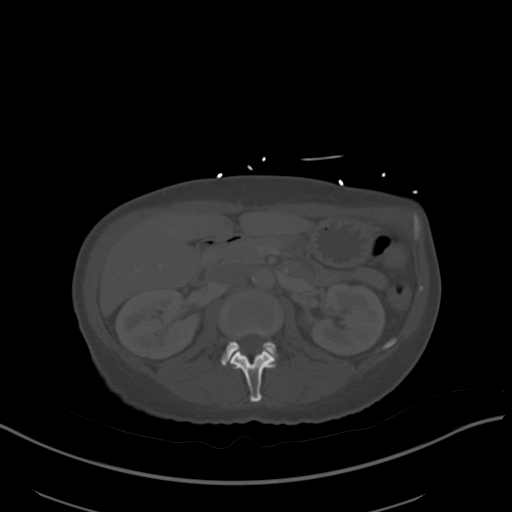
[im 67/88  soft-tissue]
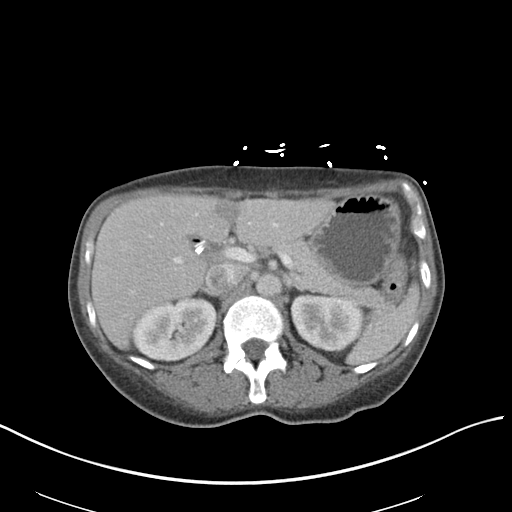
[im 74/88  soft-tissue]
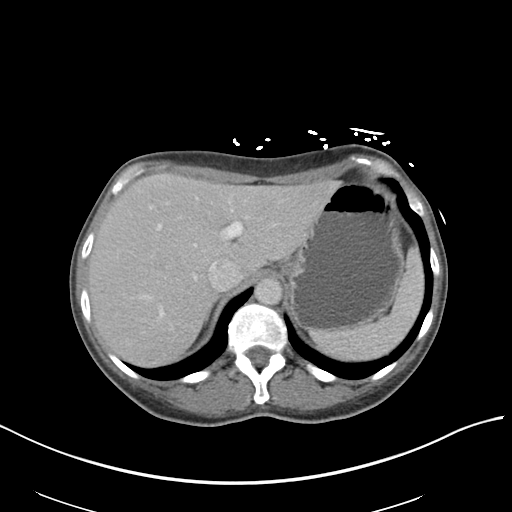
[im 81/88  soft-tissue]
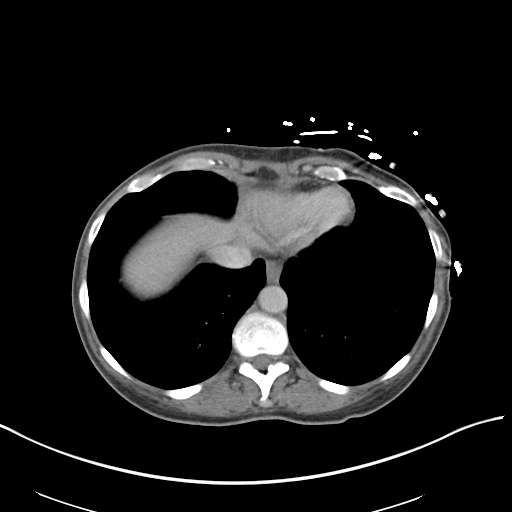

[Series 4: lung bases · axial · 0.74mm/px · z∈[-367,-355]mm · 2 of 71 slices shown]
[im 7/71  bone]
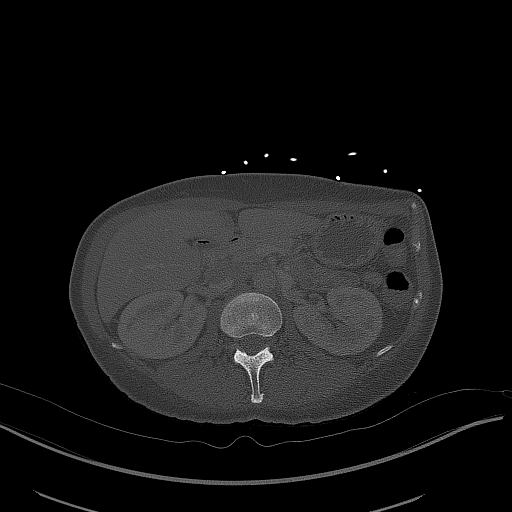
[im 13/71  bone]
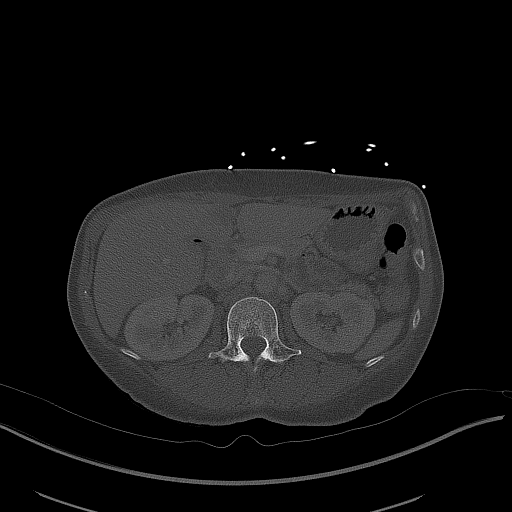

[Series 5: coronal st · coronal · 0.83mm/px · 3 of 129 slices shown]
[im 43/129  soft-tissue]
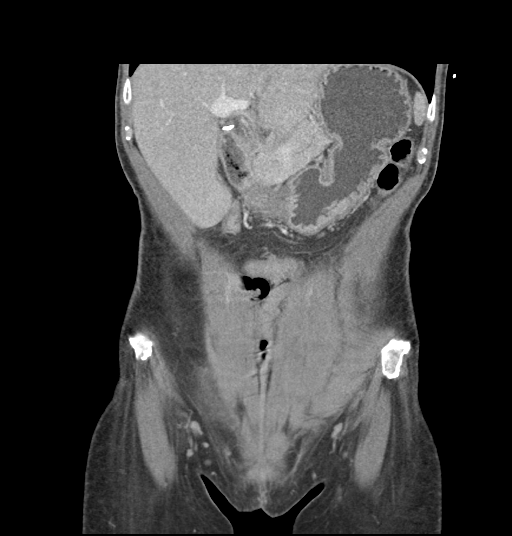
[im 57/129  soft-tissue]
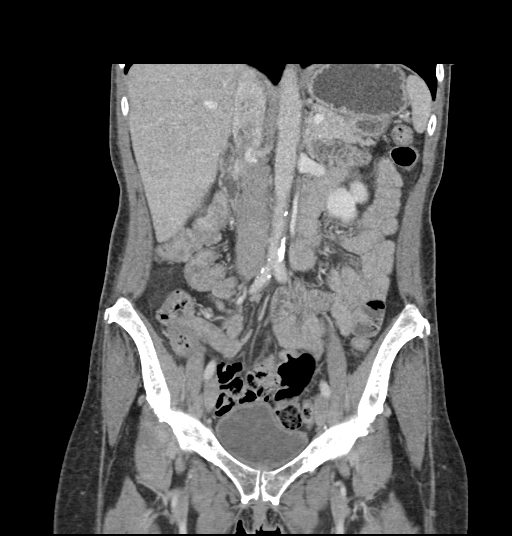
[im 72/129  soft-tissue]
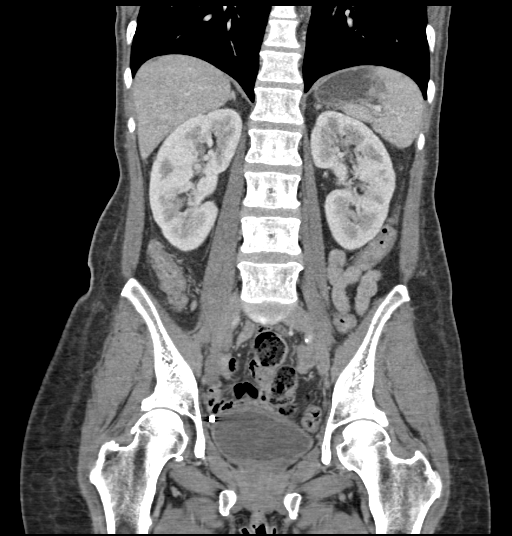

[17 of 46 positions shown; findings below may reference images not displayed]

FINDINGS: Lower chest: The lung bases are clear.

Hepatobiliary: Focal low-attenuation lesion in the medial segment
left lobe of liver, likely focal fatty infiltration. No change since
prior study. Gallbladder is surgically absent. No bile duct
dilatation.

Pancreas: Unremarkable. No pancreatic ductal dilatation or
surrounding inflammatory changes.

Spleen: Normal in size without focal abnormality.

Adrenals/Urinary Tract: Adrenal glands are unremarkable. Kidneys are
normal, without renal calculi, focal lesion, or hydronephrosis.
Bladder is unremarkable.

Stomach/Bowel: Stomach is within normal limits. Appendix appears
normal. No evidence of bowel wall thickening, distention, or
inflammatory changes.

Vascular/Lymphatic: Aortic atherosclerosis. No enlarged abdominal or
pelvic lymph nodes.

Reproductive: Uterus and bilateral adnexa are unremarkable.

Other: No abdominal wall hernia or abnormality. No abdominopelvic
ascites.

Musculoskeletal: No acute or significant osseous findings.
IMPRESSION: No acute abnormalities demonstrated in the abdomen or pelvis. No
evidence of bowel obstruction or inflammation. Appendix is normal.

## 2023-03-06 ENCOUNTER — Ambulatory Visit (HOSPITAL_COMMUNITY)
Admission: EM | Admit: 2023-03-06 | Discharge: 2023-03-06 | Disposition: A | Discharge status: Home / Self Care | Payer: Medicaid Other | Attending: Emergency Medicine | Admitting: Emergency Medicine

## 2023-03-06 ENCOUNTER — Encounter (HOSPITAL_COMMUNITY): Payer: Self-pay | Admitting: Emergency Medicine

## 2023-03-06 DIAGNOSIS — T23262A Burn of second degree of back of left hand, initial encounter: Secondary | ICD-10-CM

## 2023-03-06 MED ORDER — SILVER SULFADIAZINE 1 % EX CREA: 1.0000 | TOPICAL_CREAM | Freq: Two times a day (BID) | CUTANEOUS | 0 refills | Status: DC

## 2023-03-06 MED ORDER — SILVER SULFADIAZINE 1 % EX CREA: 1.0000 | TOPICAL_CREAM | Freq: Two times a day (BID) | CUTANEOUS | 0 refills | Status: AC

## 2023-03-06 MED ORDER — SILVER SULFADIAZINE 1 % EX CREA
TOPICAL_CREAM | CUTANEOUS | Status: AC
  Filled 2023-03-06: qty 85

## 2023-03-06 MED ORDER — SILVER SULFADIAZINE 1 % EX CREA
TOPICAL_CREAM | Freq: Once | CUTANEOUS | Status: AC
  Administered 2023-03-06: 1 via TOPICAL
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
## 2023-03-06 NOTE — Discharge Instructions (Addendum)
Apply silvadene as prescribed. Follow up with PCP. Return as needed, Go to Er for new or worsening issues or concerns.
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
## 2024-08-12 ENCOUNTER — Ambulatory Visit (INDEPENDENT_AMBULATORY_CARE_PROVIDER_SITE_OTHER)

## 2024-08-12 ENCOUNTER — Ambulatory Visit (HOSPITAL_COMMUNITY)
Admission: EM | Admit: 2024-08-12 | Discharge: 2024-08-12 | Disposition: A | Discharge status: Home / Self Care | Attending: Physician Assistant | Admitting: Physician Assistant

## 2024-08-12 ENCOUNTER — Encounter (HOSPITAL_COMMUNITY): Payer: Self-pay

## 2024-08-12 DIAGNOSIS — R1010 Upper abdominal pain, unspecified: Secondary | ICD-10-CM

## 2024-08-12 DIAGNOSIS — K5900 Constipation, unspecified: Secondary | ICD-10-CM

## 2024-08-12 DIAGNOSIS — M545 Low back pain, unspecified: Secondary | ICD-10-CM

## 2024-08-12 DIAGNOSIS — R110 Nausea: Secondary | ICD-10-CM | POA: Diagnosis not present

## 2024-08-12 DIAGNOSIS — I878 Other specified disorders of veins: Secondary | ICD-10-CM | POA: Diagnosis not present

## 2024-08-12 DIAGNOSIS — Z9049 Acquired absence of other specified parts of digestive tract: Secondary | ICD-10-CM | POA: Diagnosis not present

## 2024-08-12 MED ORDER — LIDOCAINE 5 % EX PTCH: 1.0000 | MEDICATED_PATCH | CUTANEOUS | 0 refills | Status: AC

## 2024-08-12 MED ORDER — METHOCARBAMOL 500 MG PO TABS: 500.0000 mg | ORAL_TABLET | Freq: Two times a day (BID) | ORAL | 0 refills | Status: AC

## 2024-08-12 MED ORDER — LACTULOSE 10 GM/15ML PO SOLN: 10.0000 g | Freq: Every day | ORAL | 0 refills | Status: AC | PRN

## 2024-08-12 NOTE — ED Triage Notes (Signed)
 Pt states that she has back pain. X2-3 weeks Pt states that she also has some nausea. 1-2 weeks

## 2024-08-12 NOTE — Discharge Instructions (Signed)
 Your abdominal x-ray did not show any evidence of obstruction which is good news.  I believe that your abdominal pain is from constipation.  Start lactulose .  Make sure that you are eating plenty of fiber and drinking lots of fluid.  As we discussed, if you do not have a bowel movement within 24 hours or if anything worsens and you have severe abdominal pain, nausea/vomiting, fever you need to go to the emergency room immediately.  The x-ray of your lower back did not show any thing that was out of place or broken which is good news.  Start Robaxin  up to twice a day.  This will make you sleepy so do not drive or drink alcohol taking it.  Use Tylenol  for additional pain relief.  I have also called in lidocaine  patch that you can apply during the day and then remove at night using only 1 patch per 24 hours.  If your symptoms are not improving please follow-up with your primary care as you may benefit from seeing physical therapy or additional imaging that we do not have access to an urgent care.  If anything worsens and you have going to the bathroom yourself without noticing it, weakness in your legs, numbness or tingling on the inside of your legs you need to be seen immediately.
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
